# Patient Record
Sex: Female | Born: 1980 | Race: Black or African American | Hispanic: No | Marital: Single | Smoking: Never smoker
Health system: Southern US, Community
[De-identification: ages and names within clinical notes are randomized; demographics above are authoritative.]

## PROBLEM LIST (undated history)

## (undated) DIAGNOSIS — F988 Other specified behavioral and emotional disorders with onset usually occurring in childhood and adolescence: Secondary | ICD-10-CM

## (undated) DIAGNOSIS — F32A Depression, unspecified: Secondary | ICD-10-CM

## (undated) DIAGNOSIS — F411 Generalized anxiety disorder: Secondary | ICD-10-CM

## (undated) DIAGNOSIS — E559 Vitamin D deficiency, unspecified: Secondary | ICD-10-CM

## (undated) DIAGNOSIS — G43909 Migraine, unspecified, not intractable, without status migrainosus: Secondary | ICD-10-CM

## (undated) DIAGNOSIS — M4800 Spinal stenosis, site unspecified: Secondary | ICD-10-CM

## (undated) DIAGNOSIS — M1990 Unspecified osteoarthritis, unspecified site: Secondary | ICD-10-CM

## (undated) DIAGNOSIS — G4730 Sleep apnea, unspecified: Secondary | ICD-10-CM

## (undated) DIAGNOSIS — F4323 Adjustment disorder with mixed anxiety and depressed mood: Secondary | ICD-10-CM

## (undated) DIAGNOSIS — F329 Major depressive disorder, single episode, unspecified: Secondary | ICD-10-CM

## (undated) DIAGNOSIS — R87619 Unspecified abnormal cytological findings in specimens from cervix uteri: Secondary | ICD-10-CM

## (undated) DIAGNOSIS — E669 Obesity, unspecified: Secondary | ICD-10-CM

## (undated) HISTORY — DX: Obesity, unspecified: E66.9

## (undated) HISTORY — DX: Unspecified osteoarthritis, unspecified site: M19.90

## (undated) HISTORY — DX: Sleep apnea, unspecified: G47.30

## (undated) HISTORY — DX: Spinal stenosis, site unspecified: M48.00

## (undated) HISTORY — DX: Generalized anxiety disorder: F41.1

## (undated) HISTORY — DX: Unspecified abnormal cytological findings in specimens from cervix uteri: R87.619

## (undated) HISTORY — DX: Major depressive disorder, single episode, unspecified: F32.9

## (undated) HISTORY — DX: Vitamin D deficiency, unspecified: E55.9

## (undated) HISTORY — DX: Other specified behavioral and emotional disorders with onset usually occurring in childhood and adolescence: F98.8

## (undated) HISTORY — PX: COLPOSCOPY: SHX161

## (undated) HISTORY — DX: Migraine, unspecified, not intractable, without status migrainosus: G43.909

## (undated) HISTORY — DX: Depression, unspecified: F32.A

## (undated) HISTORY — DX: Adjustment disorder with mixed anxiety and depressed mood: F43.23

---

## 2003-04-11 HISTORY — PX: OTHER SURGICAL HISTORY: SHX169

## 2009-04-10 HISTORY — PX: BREAST SURGERY: SHX581

## 2009-04-27 ENCOUNTER — Encounter: Payer: Self-pay | Admitting: Family Medicine

## 2009-04-27 LAB — CONVERTED CEMR LAB
ALT: 12 units/L
AST: 13 units/L
Creatinine, Ser: 0.69 mg/dL
Glucose: 77 mg/dL
Platelets: 398 10*3/uL

## 2010-05-10 ENCOUNTER — Encounter: Payer: Self-pay | Admitting: Family Medicine

## 2010-05-10 DIAGNOSIS — E559 Vitamin D deficiency, unspecified: Secondary | ICD-10-CM | POA: Insufficient documentation

## 2010-05-18 NOTE — Miscellaneous (Signed)
Summary: old records  Clinical Lists Changes  Problems: Added new problem of UNSPECIFIED VITAMIN D DEFICIENCY (ICD-268.9) Allergies: Added new allergy or adverse reaction of * STRAWBERRIES Added new allergy or adverse reaction of PCN Observations: Added new observation of NKA: F (05/10/2010 13:09) Added new observation of PAST SURG HX: LTCS (05/10/2010 13:09) Added new observation of PAST MED HX: migraines obesity GAD depression-- effexor (05/10/2010 13:09) Added new observation of SGPT (ALT): 12 units/L (04/27/2009 13:09) Added new observation of SGOT (AST): 13 units/L (04/27/2009 13:09) Added new observation of CREATININE: 0.69 mg/dL (16/01/9603 54:09) Added new observation of GLU MON POC: 77 mg/dL (81/19/1478 29:56) Added new observation of PLATELETK/UL: 398 K/uL (04/27/2009 13:09) Added new observation of HGB: 11.5 g/dL (21/30/8657 84:69) Added new observation of WBC COUNT: 6.0 10*3/microliter (04/27/2009 13:09)       Past History:  Past Medical History: migraines obesity GAD depression-- effexor  Past Surgical History: LTCS

## 2010-05-23 ENCOUNTER — Ambulatory Visit: Payer: Self-pay | Admitting: Family Medicine

## 2010-06-03 ENCOUNTER — Encounter: Payer: Self-pay | Admitting: Family Medicine

## 2010-06-03 ENCOUNTER — Ambulatory Visit (INDEPENDENT_AMBULATORY_CARE_PROVIDER_SITE_OTHER): Payer: BC Managed Care – PPO | Admitting: Family Medicine

## 2010-06-03 DIAGNOSIS — G4730 Sleep apnea, unspecified: Secondary | ICD-10-CM | POA: Insufficient documentation

## 2010-06-03 DIAGNOSIS — E669 Obesity, unspecified: Secondary | ICD-10-CM

## 2010-06-03 DIAGNOSIS — F411 Generalized anxiety disorder: Secondary | ICD-10-CM

## 2010-06-07 NOTE — Assessment & Plan Note (Signed)
Summary: NOV: Anxiety, obesity   Vital Signs:  Patient profile:   30 year old female Height:      64 inches Weight:      260.25 pounds BMI:     44.83 Temp:     98.5 degrees F oral Pulse rate:   87 / minute BP sitting:   141 / 89  (right arm) Cuff size:   large  Vitals Entered By: Glendell Docker CMA (June 03, 2010 1:23 PM)  Serial Vital Signs/Assessments:  Time      Position  BP       Pulse  Resp  Temp     By 2:31 PM             137/89                         Nani Gasser MD  CC: New Patient   CC:  New Patient.  History of Present Illness: No CP or SOB. NO prior hxof HTN. Not lseeping well and recently dx wtih sleep apnea. She is schedule for the CPAP titration in about 2 weeks.  Very wasily stressed. She feels she worrier. In general she feels it does interfere with her life. Has been on Effexor for over a year.  Mother is here with her and definitely feels she stresses too much.   Habits & Providers  Alcohol-Tobacco-Diet     Alcohol drinks/day: <1     Tobacco Status: never  Exercise-Depression-Behavior     Does Patient Exercise: yes     Times/week: 3     Drug Use: no  Allergies: 1)  ! Pcn 2)  ! 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  Has one son [name].  Never Smoked Alcohol use-yes Drug use-no Regular exercise-yes No caffine intake. Does Patient Exercise:  yes Smoking Status:  never Drug Use:  no  Review of Systems       No fever/sweats/weakness, unexplained weight loss/gain.  No vison changes.  No difficulty hearing/ringing in ears, hay fever/allergies.  No chest pain/discomfort, palpitations.  No Br lump/nipple discharge.  No cough/wheeze.  No blood in BM, nausea/vomiting/diarrhea.  No nighttime urination, leaking urine, unusual vaginal bleeding, discharge (penis or vagina).  No muscle/joint pain. No rash, change  in mole.  No HA, memory loss.  + anxiety, no sleep d/o, + depression.  No easy bruising/bleeding, unexplained lump   Physical Exam  General:  Well-developed,well-nourished,in no acute distress; alert,appropriate and cooperative throughout examination Head:  Normocephalic and atraumatic without obvious abnormalities. No apparent alopecia or balding. Eyes:  No corneal or conjunctival inflammation noted. EOMI. Perrla. Lungs:  Normal respiratory effort, chest expands symmetrically. Lungs are clear to auscultation, no crackles or wheezes. Heart:  Normal rate and regular rhythm. S1 and S2 normal without gallop, murmur, click, rub or other extra sounds. Skin:  no rashes.   Cervical Nodes:  No lymphadenopathy noted Psych:  Cognition and judgment appear intact. Alert and cooperative with normal attention span and concentration. No apparent delusions, illusions, hallucinations   Impression & Recommendations:  Problem # 1:  ANXIETY STATE, UNSPECIFIED (ICD-300.00) Discussed options. at this point in time wiht her recent dx of OSA will get this treated and see how much of her mood improves and then can adjust meds as needed. Also recommended she consider behavioral therapy long term. She will think  about this .  Her updated medication list for this problem includes:    Effexor Xr 150 Mg Xr24h-cap (Venlafaxine hcl) .Marland Kitchen... Take 1 tablet by mouth once a day  Problem # 2:  OBESITY (ICD-278.00) She is currently on phentermine for [employer] at work.   Problem # 3:  ELEVATED BLOOD PRESSURE WITHOUT DIAGNOSIS OF HYPERTENSION (ICD-796.2) Discssed dx. Reduce salt in the diet. WEight loss will help. Have her nurse at work recheck it as well to make suer not from the phentermine.  Recheck at f/u as well.    Complete Medication List: 1)  Ovcon-35 (28) 0.4-35 Mg-mcg Tabs (Norethindrone-eth estradiol) .... Take 1 tablet by mouth once a day 2)  Effexor Xr 150 Mg Xr24h-cap (Venlafaxine hcl) .... Take 1 tablet by mouth once  a day 3)  Phentermine Hcl 37.5 Mg Tabs (Phentermine hcl) .... Take 1 tablet by mouth once a day  Patient Instructions: 1)  Please schedule a follow-up appointment in 2 months for mood. .    Orders Added: 1)  New Patient Level III [99203]   Immunization History:  Tetanus/Td Immunization History:    Tetanus/Td:  historical (07/23/2001)  Influenza Immunization History:    Influenza:  historical (04/26/2010)   Immunization History:  Tetanus/Td Immunization History:    Tetanus/Td:  Historical (07/23/2001)  Influenza Immunization History:    Influenza:  Historical (04/26/2010)  Current Allergies (reviewed today): ! PCN ! * STRAWBERRIES     Immunization History:  Tetanus/Td Immunization History:    Tetanus/Td:  historical (07/23/2001)  Influenza Immunization History:    Influenza:  historical (04/26/2010)

## 2010-06-15 ENCOUNTER — Encounter: Payer: Self-pay | Admitting: Family Medicine

## 2010-06-21 NOTE — Letter (Signed)
Summary: Intake Forms  Intake Forms   Imported By: Lanelle Bal 06/17/2010 11:10:08  _____________________________________________________________________  External Attachment:    Type:   Image     Comment:   External Document

## 2010-09-16 ENCOUNTER — Ambulatory Visit (INDEPENDENT_AMBULATORY_CARE_PROVIDER_SITE_OTHER): Payer: Self-pay | Admitting: Family Medicine

## 2010-09-16 ENCOUNTER — Encounter: Payer: Self-pay | Admitting: Family Medicine

## 2010-09-16 DIAGNOSIS — F411 Generalized anxiety disorder: Secondary | ICD-10-CM

## 2010-09-16 DIAGNOSIS — G4730 Sleep apnea, unspecified: Secondary | ICD-10-CM

## 2010-09-16 DIAGNOSIS — S0093XA Contusion of unspecified part of head, initial encounter: Secondary | ICD-10-CM

## 2010-09-16 DIAGNOSIS — S0003XA Contusion of scalp, initial encounter: Secondary | ICD-10-CM

## 2010-09-16 DIAGNOSIS — S1093XA Contusion of unspecified part of neck, initial encounter: Secondary | ICD-10-CM

## 2010-09-16 MED ORDER — CITALOPRAM HYDROBROMIDE 20 MG PO TABS: ORAL_TABLET | ORAL | Status: DC

## 2010-09-16 MED ORDER — VENLAFAXINE HCL ER 37.5 MG PO CP24: 37.5000 mg | ORAL_CAPSULE | Freq: Every day | ORAL | Status: DC
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
## 2010-09-16 NOTE — Assessment & Plan Note (Signed)
Discussed GAD-7 score of 13, PHQ-9 score of 19. Not well controlled. Discussed she is really on a high dose of Effexor to not have better results so recommend  Changing medication.  Will wean down and change to citalopram.  Given taper regimen. F/U in 5 weeks.  Call if anyconcerns or if feels too emotional on the wean.  FOrtunately no large stressors in her life right now.

## 2010-09-16 NOTE — Patient Instructions (Signed)
Effexor 37.5mg  take 2 a day for one week then drop to one a day for one week.  Then start the new medicine the next day.

## 2010-09-16 NOTE — Assessment & Plan Note (Signed)
9 cm water of pressure

## 2010-10-20 ENCOUNTER — Encounter: Payer: Self-pay | Admitting: Family Medicine

## 2010-10-20 ENCOUNTER — Ambulatory Visit (INDEPENDENT_AMBULATORY_CARE_PROVIDER_SITE_OTHER): Payer: BC Managed Care – PPO | Admitting: Family Medicine

## 2010-10-20 VITALS — BP 129/83 | HR 94 | Ht 64.0 in | Wt 259.0 lb

## 2010-10-20 DIAGNOSIS — R51 Headache: Secondary | ICD-10-CM

## 2010-10-20 DIAGNOSIS — G43909 Migraine, unspecified, not intractable, without status migrainosus: Secondary | ICD-10-CM | POA: Insufficient documentation

## 2010-10-20 DIAGNOSIS — F419 Anxiety disorder, unspecified: Secondary | ICD-10-CM

## 2010-10-20 DIAGNOSIS — F411 Generalized anxiety disorder: Secondary | ICD-10-CM

## 2010-10-20 MED ORDER — CITALOPRAM HYDROBROMIDE 40 MG PO TABS: 40.0000 mg | ORAL_TABLET | ORAL | Status: DC

## 2010-10-20 MED ORDER — PROPRANOLOL HCL ER 80 MG PO CP24: 80.0000 mg | ORAL_CAPSULE | Freq: Every day | ORAL | Status: DC

## 2010-10-20 NOTE — Assessment & Plan Note (Addendum)
PHQ-9 score of 9, GAD-7 of 18.  Overall she has noticed improvement, and feels it is working better for her than the Effexor. Will inc citalopram to 40mg  and f/u in 6 weeks. Call if any concerns with side effects.

## 2010-10-20 NOTE — Progress Notes (Signed)
  Subjective:    Patient ID: Gabrielle Goodman, female    DOB: [birth year], 30 y.o.   MRN: [id number]  HPI Seh did well wth the change on her medications. She did feel dizziness lasted about 1.5 week as she transitioned. . Mind is racing at nigh,t to difficutl to stay asleep. She has no difficulty initiating sleep. Taking her med in the AM.  Has been on 20mg  citalopram for 3 weeks. She denies any other side effects.   Still having frequent HA. Says they are overall better but still having 3 per week. Will wake u pwith them and they last all day. She says she has been wearing her CPAP regularly.  Review of Systems     Objective:   Physical Exam  Constitutional: She is oriented to person, place, and time. She appears well-developed and well-nourished.  HENT:  Head: Normocephalic and atraumatic.  Cardiovascular: Normal rate, regular rhythm and normal heart sounds.   Pulmonary/Chest: Effort normal and breath sounds normal.  Neurological: She is alert and oriented to person, place, and time.  Skin: Skin is warm and dry.  Psychiatric: She has a normal mood and affect. Her behavior is normal.          Assessment & Plan:  Headache - Discussed starting something for prophylaxis. We discussed starting propranolol. I explained to her it is a blood pressure medication and can cause lightheaded with dizziness initially. If this is not tolerable and she is to call the office. Otherwise I'll see her back in 6-8 weeks. I'm hoping that getting her mood under control also help her headaches as well as her sleep quality which is still poor.

## 2010-10-26 ENCOUNTER — Telehealth: Payer: Self-pay | Admitting: Family Medicine

## 2010-10-26 NOTE — Telephone Encounter (Signed)
Pt called and said she had been experiencing CP with accompanied radiation to upper back pain #7/10.  + indigestion, although pt said she had never had a problem with reflux in the past.  Pain hurts worst with activities.  No family history or patient hx of cardiac problems.  No jaw pain or SOB, No nausea, dizziness.  Pain is sharp and intermittent.  No history of GERD. Plan:  Pt sent to the ED for cardiac workup in spite of the fact that she is only 30 yo.  Less likely, but for safety sake pt was sent since TUMS or GI cocktail had no relieved her pain. Routed to Dr. Linford Arnold for review. Jarvis Newcomer, LPN [facility]

## 2010-11-02 ENCOUNTER — Ambulatory Visit (HOSPITAL_COMMUNITY): Payer: BC Managed Care – PPO | Admitting: Licensed Clinical Social Worker

## 2010-11-23 ENCOUNTER — Encounter: Payer: Self-pay | Admitting: Family Medicine

## 2010-11-24 ENCOUNTER — Ambulatory Visit: Payer: BC Managed Care – PPO | Admitting: Family Medicine

## 2010-12-01 ENCOUNTER — Encounter: Payer: Self-pay | Admitting: Family Medicine

## 2010-12-01 ENCOUNTER — Ambulatory Visit (INDEPENDENT_AMBULATORY_CARE_PROVIDER_SITE_OTHER): Payer: BC Managed Care – PPO | Admitting: Family Medicine

## 2010-12-01 DIAGNOSIS — G43909 Migraine, unspecified, not intractable, without status migrainosus: Secondary | ICD-10-CM

## 2010-12-01 DIAGNOSIS — E669 Obesity, unspecified: Secondary | ICD-10-CM

## 2010-12-01 DIAGNOSIS — F411 Generalized anxiety disorder: Secondary | ICD-10-CM

## 2010-12-01 MED ORDER — PROPRANOLOL HCL 60 MG PO CP24: 60.0000 mg | ORAL_CAPSULE | Freq: Every day | ORAL | Status: DC

## 2010-12-01 MED ORDER — PHENTERMINE HCL 37.5 MG PO CAPS: 37.5000 mg | ORAL_CAPSULE | ORAL | Status: AC

## 2010-12-01 NOTE — Progress Notes (Signed)
  Subjective:    Patient ID: Gabrielle Goodman, female    DOB: [birth year], 30 y.o.   MRN: [id number]  HPI Migraines - Feels like the propranolol is too sedating in the morning but has been fantastic for  Her headaches.Only hd one HA since last vist. Only lasted a couple of hours. Took OTC med and went away.   Anxiety - she feels overall the citalopram overall is much better. She likes the inc dose and has noticed real improvement in her depression and anxiety.   Weight Loss - Exercises 3 day per week. Would like to be 150-180 lbs.  Weighed that before had her son. She is interested in weight loss drug. Has taken phentermine before and did well with it. Has been trying to lose weight for 3 months.  Has been trying to eat more whole grains and veggies. Says will lose a couple of lbs but then gaines in back in a couple of days. Really struggling with inc appetite. Says she constantly feels hungry. No CP or SOB.  No known cardiac dz.    Review of Systems     Objective:   Physical Exam  Constitutional: She is oriented to person, place, and time. She appears well-developed and well-nourished.  HENT:  Head: Normocephalic and atraumatic.  Cardiovascular: Normal rate, regular rhythm and normal heart sounds.   Pulmonary/Chest: Effort normal and breath sounds normal.  Neurological: She is alert and oriented to person, place, and time.  Skin: Skin is warm and dry.  Psychiatric: She has a normal mood and affect. Her behavior is normal.          Assessment & Plan:

## 2010-12-01 NOTE — Assessment & Plan Note (Signed)
Well controlled in the propranolol but too sedating. Will dec dose to 60mg  and f/u in 2 months. Can also move med to after dinner instead of bedtime and see if helps.

## 2010-12-01 NOTE — Assessment & Plan Note (Signed)
IMproved. PHq-9 still 8.  GAD-7 not admin today. F/U in 2 mo. She is happy with the improvement.

## 2010-12-01 NOTE — Assessment & Plan Note (Signed)
WE discussed the importance of diet and exercise program in conjunction with a weight loss med. Discussed risk and need to monitor BP monthly. Also has to lose weight or will not be refilled. Pt commitst o diet and exercise program with the med.

## 2010-12-22 ENCOUNTER — Other Ambulatory Visit: Payer: Self-pay | Admitting: Family Medicine

## 2011-01-26 ENCOUNTER — Ambulatory Visit (INDEPENDENT_AMBULATORY_CARE_PROVIDER_SITE_OTHER): Payer: BC Managed Care – PPO | Admitting: Family Medicine

## 2011-01-26 VITALS — BP 107/67 | HR 62 | Ht 64.0 in | Wt 256.0 lb

## 2011-01-26 DIAGNOSIS — E669 Obesity, unspecified: Secondary | ICD-10-CM

## 2011-01-26 NOTE — Progress Notes (Signed)
  Subjective:    Patient ID: Gabrielle Goodman, female    DOB: [birth year], 30 y.o.   MRN: [id number]  HPI    Review of Systems     Objective:   Physical Exam        Assessment & Plan:  Patient doing well on appetite suppressant.  Here for nurse visit, weight, BP, HR check.  Denies problems with insomnia, heart palpitations or tremors.  Satisfied with weight loss thus far and is working on [employer] and regular exercise.

## 2011-01-29 ENCOUNTER — Encounter: Payer: Self-pay | Admitting: Family Medicine

## 2011-01-29 NOTE — Patient Instructions (Signed)
Return PRN

## 2011-02-02 ENCOUNTER — Other Ambulatory Visit: Payer: Self-pay | Admitting: *Deleted

## 2011-02-02 MED ORDER — PHENTERMINE HCL 37.5 MG PO CAPS: 37.5000 mg | ORAL_CAPSULE | ORAL | Status: AC

## 2011-04-18 ENCOUNTER — Other Ambulatory Visit: Payer: Self-pay | Admitting: Family Medicine

## 2011-04-19 ENCOUNTER — Encounter: Payer: BC Managed Care – PPO | Admitting: Family Medicine

## 2011-04-20 ENCOUNTER — Other Ambulatory Visit: Payer: Self-pay | Admitting: *Deleted

## 2011-04-20 ENCOUNTER — Encounter: Payer: Self-pay | Admitting: Family Medicine

## 2011-04-20 ENCOUNTER — Ambulatory Visit (INDEPENDENT_AMBULATORY_CARE_PROVIDER_SITE_OTHER): Payer: BC Managed Care – PPO | Admitting: Family Medicine

## 2011-04-20 DIAGNOSIS — Z0000 Encounter for general adult medical examination without abnormal findings: Secondary | ICD-10-CM

## 2011-04-20 DIAGNOSIS — E559 Vitamin D deficiency, unspecified: Secondary | ICD-10-CM

## 2011-04-20 DIAGNOSIS — G4730 Sleep apnea, unspecified: Secondary | ICD-10-CM

## 2011-04-20 MED ORDER — CLINDAMYCIN PHOSPHATE 1 % EX GEL: CUTANEOUS | Status: DC

## 2011-04-20 MED ORDER — ESCITALOPRAM OXALATE 20 MG PO TABS: 20.0000 mg | ORAL_TABLET | Freq: Every day | ORAL | Status: DC

## 2011-04-20 MED ORDER — CLINDAMYCIN PHOSPHATE 1 % EX GEL: CUTANEOUS | Status: AC

## 2011-04-20 NOTE — Progress Notes (Signed)
  Subjective:     Gabrielle Goodman is a 31 y.o. female and is here for a comprehensive physical exam. The patient reports no problems.  History   Social History  . Marital Status: Single    Spouse Name: N/A    Number of Children: 1  . Years of Education: N/A   Occupational History  . medical asst    Social History Main Topics  . Smoking status: Never Smoker   . Smokeless tobacco: Not on file  . Alcohol Use: No  . Drug Use: No  . Sexually Active: Not on file   Other Topics Concern  . Not on file   Social History Narrative   No regular exercise.  No caffeine.    Health Maintenance  Topic Date Due  . Influenza Vaccine  01/09/2012  . Pap Smear  05/11/2013  . Tetanus/tdap  03/05/2021    The following portions of the patient's history were reviewed and updated as appropriate: allergies, current medications, past family history, past medical history, past social history, past surgical history and problem list.  Review of Systems A comprehensive review of systems was negative.   Objective:    BP 117/75  Pulse 95  Ht 5\' 4"  (1.626 m)  Wt 259 lb (117.482 kg)  BMI 44.46 kg/m2 General appearance: alert, cooperative, appears stated age and moderately obese Head: atraumatic Eyes: conj clear, EOMi, PEERLA Ears: normal TM's and external ear canals both ears Nose: Nares normal. Septum midline. Mucosa normal. No drainage or sinus tenderness. Throat: lips, mucosa, and tongue normal; teeth and gums normal Neck: no adenopathy, no carotid bruit, no JVD, supple, symmetrical, trachea midline and thyroid not enlarged, symmetric, no tenderness/mass/nodules Back: symmetric, no curvature. ROM normal. No CVA tenderness. Lungs: clear to auscultation bilaterally Heart: regular rate and rhythm, S1, S2 normal, no murmur, click, rub or gallop Abdomen: soft, non-tender; bowel sounds normal; no masses,  no organomegaly Extremities: extremities normal, atraumatic, no cyanosis or edema Pulses: 2+ and  symmetric Skin: Skin color, texture, turgor normal. No rashes or lesions. She does have some fine pustular acne on her upper chest. None on her face.  Lymph nodes: Cervical, supraclavicular, and axillary nodes normal. Neurologic: Grossly normal    Assessment:    Healthy female exam.     Plan:     See After Visit Summary for Counseling Recommendations  Start a regular exercise program and make sure you are eating a healthy diet Try to eat 4 servings of dairy a day or take a calcium supplement (500mg  twice a day). Your vaccines are up to date.  Given lab slip for screening labs.   Pustular acne on her upper chest. Will try clindagel and see if clears her acne  Anxiety - Seh has noticed sig improvement in her mood but still feeling anxious at night.  Will change to lexapor 10mg  dose.  F/U in 1 mo.

## 2011-04-20 NOTE — Patient Instructions (Signed)
Start a regular exercise program and make sure you are eating a healthy diet Try to eat 4 servings of dairy a day or take a calcium supplement (500mg twice a day). Your vaccines are up to date.   

## 2011-04-21 LAB — COMPLETE METABOLIC PANEL WITH GFR
AST: 13 U/L (ref 0–37)
Albumin: 4 g/dL (ref 3.5–5.2)
Alkaline Phosphatase: 69 U/L (ref 39–117)
BUN: 8 mg/dL (ref 6–23)
Calcium: 9.3 mg/dL (ref 8.4–10.5)
Chloride: 103 mEq/L (ref 96–112)
Glucose, Bld: 74 mg/dL (ref 70–99)
Potassium: 4.5 mEq/L (ref 3.5–5.3)
Sodium: 139 mEq/L (ref 135–145)
Total Protein: 6.9 g/dL (ref 6.0–8.3)

## 2011-04-21 LAB — LIPID PANEL
Cholesterol: 196 mg/dL (ref 0–200)
LDL Cholesterol: 107 mg/dL — ABNORMAL HIGH (ref 0–99)
Total CHOL/HDL Ratio: 2.6 Ratio
VLDL: 15 mg/dL (ref 0–40)

## 2011-04-21 LAB — VITAMIN D 25 HYDROXY (VIT D DEFICIENCY, FRACTURES): Vit D, 25-Hydroxy: 25 ng/mL — ABNORMAL LOW (ref 30–89)

## 2011-06-22 ENCOUNTER — Ambulatory Visit: Payer: BC Managed Care – PPO | Admitting: Family Medicine

## 2011-06-22 DIAGNOSIS — Z0289 Encounter for other administrative examinations: Secondary | ICD-10-CM

## 2011-08-16 ENCOUNTER — Other Ambulatory Visit: Payer: Self-pay | Admitting: Family Medicine

## 2012-01-13 ENCOUNTER — Other Ambulatory Visit: Payer: Self-pay | Admitting: Family Medicine

## 2012-02-07 ENCOUNTER — Encounter (HOSPITAL_BASED_OUTPATIENT_CLINIC_OR_DEPARTMENT_OTHER): Payer: Self-pay | Admitting: *Deleted

## 2012-02-07 ENCOUNTER — Emergency Department (HOSPITAL_BASED_OUTPATIENT_CLINIC_OR_DEPARTMENT_OTHER)
Admission: EM | Admit: 2012-02-07 | Discharge: 2012-02-07 | Disposition: A | Discharge status: Home / Self Care | Payer: Medicaid Other | Attending: Emergency Medicine | Admitting: Emergency Medicine

## 2012-02-07 ENCOUNTER — Emergency Department (HOSPITAL_BASED_OUTPATIENT_CLINIC_OR_DEPARTMENT_OTHER): Payer: Medicaid Other

## 2012-02-07 DIAGNOSIS — Z8659 Personal history of other mental and behavioral disorders: Secondary | ICD-10-CM | POA: Insufficient documentation

## 2012-02-07 DIAGNOSIS — Z8669 Personal history of other diseases of the nervous system and sense organs: Secondary | ICD-10-CM | POA: Insufficient documentation

## 2012-02-07 DIAGNOSIS — R109 Unspecified abdominal pain: Secondary | ICD-10-CM | POA: Insufficient documentation

## 2012-02-07 DIAGNOSIS — Z79899 Other long term (current) drug therapy: Secondary | ICD-10-CM | POA: Insufficient documentation

## 2012-02-07 DIAGNOSIS — Z3490 Encounter for supervision of normal pregnancy, unspecified, unspecified trimester: Secondary | ICD-10-CM

## 2012-02-07 DIAGNOSIS — O9989 Other specified diseases and conditions complicating pregnancy, childbirth and the puerperium: Secondary | ICD-10-CM | POA: Insufficient documentation

## 2012-02-07 DIAGNOSIS — E669 Obesity, unspecified: Secondary | ICD-10-CM | POA: Insufficient documentation

## 2012-02-07 LAB — CBC WITH DIFFERENTIAL/PLATELET
Basophils Absolute: 0 10*3/uL (ref 0.0–0.1)
Basophils Relative: 0 % (ref 0–1)
Eosinophils Relative: 1 % (ref 0–5)
HCT: 32.6 % — ABNORMAL LOW (ref 36.0–46.0)
Lymphocytes Relative: 39 % (ref 12–46)
MCHC: 33.4 g/dL (ref 30.0–36.0)
MCV: 89.8 fL (ref 78.0–100.0)
Monocytes Absolute: 0.5 10*3/uL (ref 0.1–1.0)
Platelets: 354 10*3/uL (ref 150–400)
RDW: 14.8 % (ref 11.5–15.5)
WBC: 6.2 10*3/uL (ref 4.0–10.5)

## 2012-02-07 LAB — URINALYSIS, ROUTINE W REFLEX MICROSCOPIC
Bilirubin Urine: NEGATIVE
Hgb urine dipstick: NEGATIVE
Nitrite: NEGATIVE
Protein, ur: NEGATIVE mg/dL
Specific Gravity, Urine: 1.011 (ref 1.005–1.030)
Urobilinogen, UA: 0.2 mg/dL (ref 0.0–1.0)

## 2012-02-07 LAB — PREGNANCY, URINE: Preg Test, Ur: POSITIVE — AB

## 2012-02-07 LAB — WET PREP, GENITAL: Trich, Wet Prep: NONE SEEN

## 2012-02-07 NOTE — ED Provider Notes (Signed)
History     CSN: [id number]  Arrival date & time 02/07/12  4098   First MD Initiated Contact with Patient 02/07/12 [phone]      Chief Complaint  Patient presents with  . pelvic pain, pregnant     (Consider location/radiation/quality/duration/timing/severity/associated sxs/prior treatment) HPI Comments: Patient is a G2P1001 at 5-[redacted] weeks gestation.  She presents today with lower abd pressure that started about a week ago.  She denies any bleeding or spotting.  There is no fever and she denies and bowel or bladder complaints.    Patient is a 31 y.o. female presenting with abdominal pain. The history is provided by the patient.  Abdominal Pain The primary symptoms of the illness include abdominal pain. The primary symptoms of the illness do not include fever, nausea, vomiting, dysuria or vaginal discharge. Episode onset: one week ago. The onset of the illness was gradual. The problem has been gradually worsening.  The patient states that she believes she is currently pregnant. The patient has not had a change in bowel habit. Symptoms associated with the illness do not include chills, constipation, hematuria or frequency.    Past Medical History  Diagnosis Date  . Migraines   . Obesity   . GAD (generalized anxiety disorder)   . Depression     effexor    Past Surgical History  Procedure Date  . Ltcs 2005  . Breast surgery 2011    Breast reduction    Family History  Problem Relation Age of Onset  . Hypertension Mother   . Hypertension Maternal Grandmother   . Diabetes Mother   . Rheum arthritis Paternal Grandmother   . Diabetes Maternal Grandmother   . Hypertension Paternal Grandmother   . Breast cancer Paternal Aunt     History  Substance Use Topics  . Smoking status: Never Smoker   . Smokeless tobacco: Not on file  . Alcohol Use: No    OB History    Grav Para Term Preterm Abortions TAB SAB Ect Mult Living   1               Review of Systems  Constitutional:  Negative for fever and chills.  Gastrointestinal: Positive for abdominal pain. Negative for nausea, vomiting and constipation.  Genitourinary: Negative for dysuria, frequency, hematuria and vaginal discharge.  All other systems reviewed and are negative.    Allergies  Penicillins  Home Medications   Current Outpatient Rx  Name Route Sig Dispense Refill  . PRENATAL MULTIVITAMIN CH Oral Take 1 tablet by mouth daily.    Marland Kitchen CITALOPRAM HYDROBROMIDE 40 MG PO TABS  TAKE 1/2 TABLET BY MOUTH DAILY FOR 1 WEEK, THEN 1 TABLET DAILY 30 tablet 2  . CLINDAMYCIN PHOSPHATE 1 % EX GEL  Apply to affected area 2 times daily 60 g 0  . ESCITALOPRAM OXALATE 20 MG PO TABS Oral Take 1 tablet (20 mg total) by mouth daily. 30 tablet 1  . NORGESTIMATE-ETH ESTRADIOL 0.25-35 MG-MCG PO TABS Oral Take 1 tablet by mouth daily.      Marland Kitchen PROPRANOLOL HCL 60 MG PO CP24 Oral Take 1 capsule (60 mg total) by mouth daily. 30 capsule 1    BP 122/64  Pulse 85  Temp 99.2 F (37.3 C) (Oral)  Resp 18  Ht 5\' 4"  (1.626 m)  Wt 260 lb (117.935 kg)  BMI 44.63 kg/m2  SpO2 100%  LMP 01/02/2012  Physical Exam  Nursing note and vitals reviewed. Constitutional: She is oriented to person, place, and  time. She appears well-developed and well-nourished. No distress.  HENT:  Head: Normocephalic and atraumatic.  Neck: Normal range of motion. Neck supple.  Cardiovascular: Normal rate and regular rhythm.  Exam reveals no gallop and no friction rub.   No murmur heard. Pulmonary/Chest: Effort normal and breath sounds normal. No respiratory distress. She has no wheezes.  Abdominal: Soft. Bowel sounds are normal. She exhibits no distension. There is no tenderness.  Genitourinary: Vagina normal and uterus normal. No vaginal discharge found.       There is no cmt or adnexal masses or tenderness.  Musculoskeletal: Normal range of motion.  Neurological: She is alert and oriented to person, place, and time.  Skin: Skin is warm and dry. She is  not diaphoretic.    ED Course  Procedures (including critical care time)   Labs Reviewed  URINALYSIS, ROUTINE W REFLEX MICROSCOPIC  PREGNANCY, URINE  CBC WITH DIFFERENTIAL  HCG, QUANTITATIVE, PREGNANCY   No results found.   No diagnosis found.    MDM  The patient presents here with lower abd pain and is 5-[redacted] weeks pregnant.  This is confirmed with ultrasound.  There is no evidence of ectopic pregnancy, toa, or ovarian cyst.  No fever or wbc and exam not consistent with appendicitis.  Will discharge to home with prn tylenol.  Follow up with OB.          Geoffery Lyons, MD 02/07/12 1126

## 2012-02-07 NOTE — ED Notes (Signed)
Pt amb to room 10 with quick steady gait in nad. Pt reports lower pelvic pain since finding out she is pregnant by both hpt and blood work at her pcp on 10/23. Denies any bleeding, discharge, fevers, dysuria, back pain or other c/o. Pt describes pain as "pressure".

## 2012-02-07 NOTE — ED Notes (Signed)
Patient transported to Ultrasound 

## 2012-02-08 LAB — GC/CHLAMYDIA PROBE AMP, GENITAL
Chlamydia, DNA Probe: NEGATIVE
GC Probe Amp, Genital: NEGATIVE

## 2012-02-11 ENCOUNTER — Other Ambulatory Visit: Payer: Self-pay | Admitting: Family Medicine

## 2012-02-15 ENCOUNTER — Emergency Department (HOSPITAL_BASED_OUTPATIENT_CLINIC_OR_DEPARTMENT_OTHER): Payer: Medicaid Other

## 2012-02-15 ENCOUNTER — Emergency Department (HOSPITAL_BASED_OUTPATIENT_CLINIC_OR_DEPARTMENT_OTHER)
Admission: EM | Admit: 2012-02-15 | Discharge: 2012-02-15 | Disposition: A | Discharge status: Home / Self Care | Payer: Medicaid Other | Attending: Emergency Medicine | Admitting: Emergency Medicine

## 2012-02-15 ENCOUNTER — Encounter (HOSPITAL_BASED_OUTPATIENT_CLINIC_OR_DEPARTMENT_OTHER): Payer: Self-pay | Admitting: Emergency Medicine

## 2012-02-15 DIAGNOSIS — O9989 Other specified diseases and conditions complicating pregnancy, childbirth and the puerperium: Secondary | ICD-10-CM | POA: Insufficient documentation

## 2012-02-15 DIAGNOSIS — O99340 Other mental disorders complicating pregnancy, unspecified trimester: Secondary | ICD-10-CM | POA: Insufficient documentation

## 2012-02-15 DIAGNOSIS — E669 Obesity, unspecified: Secondary | ICD-10-CM | POA: Insufficient documentation

## 2012-02-15 DIAGNOSIS — R109 Unspecified abdominal pain: Secondary | ICD-10-CM | POA: Insufficient documentation

## 2012-02-15 DIAGNOSIS — R110 Nausea: Secondary | ICD-10-CM | POA: Insufficient documentation

## 2012-02-15 DIAGNOSIS — O209 Hemorrhage in early pregnancy, unspecified: Secondary | ICD-10-CM | POA: Insufficient documentation

## 2012-02-15 DIAGNOSIS — Z3490 Encounter for supervision of normal pregnancy, unspecified, unspecified trimester: Secondary | ICD-10-CM

## 2012-02-15 DIAGNOSIS — Z79899 Other long term (current) drug therapy: Secondary | ICD-10-CM | POA: Insufficient documentation

## 2012-02-15 LAB — ABO/RH: ABO/RH(D): B POS

## 2012-02-15 LAB — URINALYSIS, ROUTINE W REFLEX MICROSCOPIC
Bilirubin Urine: NEGATIVE
Ketones, ur: NEGATIVE mg/dL
Nitrite: NEGATIVE
Urobilinogen, UA: 0.2 mg/dL (ref 0.0–1.0)

## 2012-02-15 LAB — HCG, QUANTITATIVE, PREGNANCY: hCG, Beta Chain, Quant, S: 24608 m[IU]/mL — ABNORMAL HIGH (ref <5)

## 2012-02-15 NOTE — ED Notes (Signed)
[redacted] weeks pregnant and having vaginal spotting x 1 hour. Slight abdominal cramping.

## 2012-02-15 NOTE — ED Notes (Signed)
Pt returned from US at this time.

## 2012-02-15 NOTE — ED Notes (Signed)
Pt given water per request

## 2012-02-15 NOTE — ED Provider Notes (Signed)
History     CSN: [id number]  Arrival date & time 02/15/12  1555   First MD Initiated Contact with Patient 02/15/12 1700      Chief Complaint  Patient presents with  . Vaginal Bleeding    (Consider location/radiation/quality/duration/timing/severity/associated sxs/prior treatment) Patient is a 31 y.o. female presenting with vaginal bleeding. The history is provided by the patient. No language interpreter was used.  Vaginal Bleeding This is a new problem. The current episode started today. The problem occurs constantly. The problem has been unchanged. Associated symptoms include abdominal pain and nausea. Nothing aggravates the symptoms. She has tried nothing for the symptoms. Improvement on treatment: no treatment.  Pt reports she was spotting today.   Pt is early pregnant.    Past Medical History  Diagnosis Date  . Migraines   . Obesity   . GAD (generalized anxiety disorder)   . Depression     effexor    Past Surgical History  Procedure Date  . Ltcs 2005  . Breast surgery 2011    Breast reduction    Family History  Problem Relation Age of Onset  . Hypertension Mother   . Hypertension Maternal Grandmother   . Diabetes Mother   . Rheum arthritis Paternal Grandmother   . Diabetes Maternal Grandmother   . Hypertension Paternal Grandmother   . Breast cancer Paternal Aunt     History  Substance Use Topics  . Smoking status: Never Smoker   . Smokeless tobacco: Not on file  . Alcohol Use: No    OB History    Grav Para Term Preterm Abortions TAB SAB Ect Mult Living   1               Review of Systems  Gastrointestinal: Positive for nausea and abdominal pain.  Genitourinary: Positive for vaginal bleeding.  All other systems reviewed and are negative.    Allergies  Penicillins  Home Medications   Current Outpatient Rx  Name  Route  Sig  Dispense  Refill  . CITALOPRAM HYDROBROMIDE 40 MG PO TABS      TAKE 1/2 TABLET BY MOUTH DAILY FOR 1 WEEK, THEN 1  TABLET DAILY   30 tablet   2   . CITALOPRAM HYDROBROMIDE 40 MG PO TABS      TAKE 1/2 TABLET BY MOUTH DAILY FOR 1 WEEK, THEN 1 TABLET DAILY   30 tablet   2   . CLINDAMYCIN PHOSPHATE 1 % EX GEL      Apply to affected area 2 times daily   60 g   0   . ESCITALOPRAM OXALATE 20 MG PO TABS   Oral   Take 1 tablet (20 mg total) by mouth daily.   30 tablet   1   . NORGESTIMATE-ETH ESTRADIOL 0.25-35 MG-MCG PO TABS   Oral   Take 1 tablet by mouth daily.           Marland Kitchen PRENATAL MULTIVITAMIN CH   Oral   Take 1 tablet by mouth daily.         Marland Kitchen PROPRANOLOL HCL 60 MG PO CP24   Oral   Take 1 capsule (60 mg total) by mouth daily.   30 capsule   1     BP 116/71  Pulse 79  Temp 99 F (37.2 C) (Oral)  Resp 20  SpO2 100%  LMP 01/02/2012  Physical Exam  Nursing note and vitals reviewed. Constitutional: She appears well-developed and well-nourished.  HENT:  Head: Normocephalic and atraumatic.  Eyes: Pupils are equal, round, and reactive to light.  Neck: Normal range of motion. Neck supple.  Cardiovascular: Normal rate and normal heart sounds.   Pulmonary/Chest: Effort normal.  Abdominal: Soft. There is tenderness.  Musculoskeletal: Normal range of motion.  Neurological: She is alert.  Skin: Skin is warm.  Psychiatric: She has a normal mood and affect.    ED Course  Procedures (including critical care time)  Labs Reviewed  PREGNANCY, URINE - Abnormal; Notable for the following:    Preg Test, Ur POSITIVE (*)     All other components within normal limits  URINALYSIS, ROUTINE W REFLEX MICROSCOPIC  HCG, QUANTITATIVE, PREGNANCY  ABO/RH   No results found.   No diagnosis found.    MDM   Results for orders placed during the hospital encounter of 02/15/12  URINALYSIS, ROUTINE W REFLEX MICROSCOPIC      Component Value Range   Color, Urine YELLOW  YELLOW   APPearance CLEAR  CLEAR   Specific Gravity, Urine 1.014  1.005 - 1.030   pH 6.0  5.0 - 8.0   Glucose, UA  NEGATIVE  NEGATIVE mg/dL   Hgb urine dipstick NEGATIVE  NEGATIVE   Bilirubin Urine NEGATIVE  NEGATIVE   Ketones, ur NEGATIVE  NEGATIVE mg/dL   Protein, ur NEGATIVE  NEGATIVE mg/dL   Urobilinogen, UA 0.2  0.0 - 1.0 mg/dL   Nitrite NEGATIVE  NEGATIVE   Leukocytes, UA NEGATIVE  NEGATIVE  PREGNANCY, URINE      Component Value Range   Preg Test, Ur POSITIVE (*) NEGATIVE  HCG, QUANTITATIVE, PREGNANCY      Component Value Range   hCG, Beta Chain, Quant, S 86578 (*) <5 mIU/mL  ABO/RH      Component Value Range   ABO/RH(D) B POS     No rh immune globuloin NOT A RH IMMUNE GLOBULIN CANDIDATE, PT RH POSITIVE     US Ob Comp Less 14 Wks  02/15/2012  *RADIOLOGY REPORT*  Clinical Data: 31 year old G2 P1, LMP 01/02/2012 (6 weeks 2 days), quantitative beta HCG 24,608, presenting with vaginal spotting.  OBSTETRIC <14 WK [place] AND TRANSVAGINAL OB US  Technique:  Both transabdominal and transvaginal ultrasound examinations were performed for complete evaluation of the gestation as well as the maternal uterus, adnexal regions, and pelvic cul-de-sac.  Transvaginal technique was performed to assess early pregnancy.  Comparison:  Early OB ultrasound 02/07/2012.  Intrauterine gestational sac:  Single, normal in shape. Yolk sac: Visualized. Embryo: Visualized. Cardiac Activity: Visualized. Heart Rate: 119 bpm  CRL: 5  mm  6 w  2 d          [place] EDC: 10/08/2012.  Maternal uterus/adnexae: No subchorionic hemorrhage.  Normal decidual reaction.  Ovaries not visualized.  No adnexal masses or free pelvic fluid.  IMPRESSION:  1.  Single live intrauterine embryo with a gestational age [redacted] weeks 2 days by crown-rump length, corresponding exactly with the estimated gestational age by the initial ultrasound and the LMP. [facility] 10/08/2012. 2.  No subchorionic hemorrhage. 3.  Nonvisualization of the ovaries.  No adnexal masses or free pelvic fluid.   Original Report Authenticated By: Hulan Saas, M.D.    US Ob Comp Less 14  Wks  02/07/2012  *RADIOLOGY REPORT*  Clinical Data: 5 weeks 1 day pregnant by last menstrual period. Pelvic pressure.  Morbid obesity.  OBSTETRIC <14 WK [place] AND TRANSVAGINAL OB US  Technique:  Both transabdominal and transvaginal ultrasound examinations were performed for complete evaluation of the gestation as well  as the maternal uterus, adnexal regions, and pelvic cul-de-sac.  Transvaginal technique was performed to assess early pregnancy.  Comparison:  None.  Intrauterine gestational sac:  Visualized/normal in shape. Yolk sac: Present Embryo: Not identified. Cardiac Activity: Not applicable  MSD: 8 mm  5 w 4 d      [place] EDC: 10/05/2012  Maternal uterus/adnexae: No subchorionic hemorrhage.  Normal appearance of the right ovary. A hypoechoic 1.7 cm left ovarian lesion could represent a corpus luteal cyst.  No significant free fluid.  IMPRESSION: Gestational sac and yolk sac identified, most consistent with an intrauterine pregnancy of approximately 5 weeks 4 days.  Probable left ovarian corpus luteal cyst.   Original Report Authenticated By: Consuello Bossier, M.D.    US Ob Transvaginal  02/15/2012  *RADIOLOGY REPORT*  Clinical Data: 31 year old G2 P1, LMP 01/02/2012 (6 weeks 2 days), quantitative beta HCG 24,608, presenting with vaginal spotting.  OBSTETRIC <14 WK [place] AND TRANSVAGINAL OB US  Technique:  Both transabdominal and transvaginal ultrasound examinations were performed for complete evaluation of the gestation as well as the maternal uterus, adnexal regions, and pelvic cul-de-sac.  Transvaginal technique was performed to assess early pregnancy.  Comparison:  Early OB ultrasound 02/07/2012.  Intrauterine gestational sac:  Single, normal in shape. Yolk sac: Visualized. Embryo: Visualized. Cardiac Activity: Visualized. Heart Rate: 119 bpm  CRL: 5  mm  6 w  2 d          [place] EDC: 10/08/2012.  Maternal uterus/adnexae: No subchorionic hemorrhage.  Normal decidual reaction.  Ovaries not visualized.  No adnexal  masses or free pelvic fluid.  IMPRESSION:  1.  Single live intrauterine embryo with a gestational age [redacted] weeks 2 days by crown-rump length, corresponding exactly with the estimated gestational age by the initial ultrasound and the LMP. [facility] 10/08/2012. 2.  No subchorionic hemorrhage. 3.  Nonvisualization of the ovaries.  No adnexal masses or free pelvic fluid.   Original Report Authenticated By: Hulan Saas, M.D.    US Ob Transvaginal  02/07/2012  *RADIOLOGY REPORT*  Clinical Data: 5 weeks 1 day pregnant by last menstrual period. Pelvic pressure.  Morbid obesity.  OBSTETRIC <14 WK [place] AND TRANSVAGINAL OB US  Technique:  Both transabdominal and transvaginal ultrasound examinations were performed for complete evaluation of the gestation as well as the maternal uterus, adnexal regions, and pelvic cul-de-sac.  Transvaginal technique was performed to assess early pregnancy.  Comparison:  None.  Intrauterine gestational sac:  Visualized/normal in shape. Yolk sac: Present Embryo: Not identified. Cardiac Activity: Not applicable  MSD: 8 mm  5 w 4 d      [place] EDC: 10/05/2012  Maternal uterus/adnexae: No subchorionic hemorrhage.  Normal appearance of the right ovary. A hypoechoic 1.7 cm left ovarian lesion could represent a corpus luteal cyst.  No significant free fluid.  IMPRESSION: Gestational sac and yolk sac identified, most consistent with an intrauterine pregnancy of approximately 5 weeks 4 days.  Probable left ovarian corpus luteal cyst.   Original Report Authenticated By: Consuello Bossier, M.D.      Pt's blood type is B positive.   Pt does not need rhogam.  Ultrasound and hcg consistent with growing viable pregnancy      Elson Areas, [place] 02/15/12 2033  Lonia Skinner [place], [place] 02/15/12 2035

## 2012-02-16 NOTE — ED Provider Notes (Signed)
Medical screening examination/treatment/procedure(s) were performed by non-physician practitioner and as supervising physician I was immediately available for consultation/collaboration.   Carleene Cooper III, MD 02/16/12 [phone]

## 2012-03-23 ENCOUNTER — Encounter (HOSPITAL_BASED_OUTPATIENT_CLINIC_OR_DEPARTMENT_OTHER): Payer: Self-pay | Admitting: *Deleted

## 2012-03-23 DIAGNOSIS — Z79899 Other long term (current) drug therapy: Secondary | ICD-10-CM | POA: Insufficient documentation

## 2012-03-23 DIAGNOSIS — E669 Obesity, unspecified: Secondary | ICD-10-CM | POA: Insufficient documentation

## 2012-03-23 DIAGNOSIS — G43909 Migraine, unspecified, not intractable, without status migrainosus: Secondary | ICD-10-CM | POA: Insufficient documentation

## 2012-03-23 DIAGNOSIS — F411 Generalized anxiety disorder: Secondary | ICD-10-CM | POA: Insufficient documentation

## 2012-03-23 DIAGNOSIS — F3289 Other specified depressive episodes: Secondary | ICD-10-CM | POA: Insufficient documentation

## 2012-03-23 DIAGNOSIS — Z331 Pregnant state, incidental: Secondary | ICD-10-CM | POA: Insufficient documentation

## 2012-03-23 DIAGNOSIS — F329 Major depressive disorder, single episode, unspecified: Secondary | ICD-10-CM | POA: Insufficient documentation

## 2012-03-23 NOTE — ED Notes (Signed)
Pt is [redacted] weeks pregnant and has a hx of migraines. Has had this one for 3 days. Called OB, and they told her to take Excedrin Migraine, which she did, but no relief. PERL. Denies other s/s.

## 2012-03-24 ENCOUNTER — Emergency Department (HOSPITAL_BASED_OUTPATIENT_CLINIC_OR_DEPARTMENT_OTHER)
Admission: EM | Admit: 2012-03-24 | Discharge: 2012-03-24 | Disposition: A | Discharge status: Home / Self Care | Payer: Medicaid Other | Attending: Emergency Medicine | Admitting: Emergency Medicine

## 2012-03-24 DIAGNOSIS — G43909 Migraine, unspecified, not intractable, without status migrainosus: Secondary | ICD-10-CM

## 2012-03-24 MED ORDER — SODIUM CHLORIDE 0.9 % IV BOLUS (SEPSIS)
1000.0000 mL | Freq: Once | INTRAVENOUS | Status: AC
  Administered 2012-03-24: 1000 mL via INTRAVENOUS

## 2012-03-24 MED ORDER — DIPHENHYDRAMINE HCL 50 MG/ML IJ SOLN
12.5000 mg | Freq: Once | INTRAMUSCULAR | Status: AC
Start: 2012-03-24 — End: 2012-03-24
  Administered 2012-03-24: 12.5 mg via INTRAVENOUS
  Filled 2012-03-24: qty 1

## 2012-03-24 MED ORDER — METOCLOPRAMIDE HCL 5 MG/ML IJ SOLN
10.0000 mg | Freq: Once | INTRAMUSCULAR | Status: AC
  Administered 2012-03-24: 10 mg via INTRAVENOUS
  Filled 2012-03-24: qty 2

## 2012-03-24 NOTE — ED Provider Notes (Signed)
History    This chart was scribed for Gabrielle Sherk Gabrielle Cords, MD, MD by Gabrielle Goodman, ED Scribe. The patient was seen in room MH01 and the patient's care was started at 12:15PM.   CSN: [id number]  Arrival date & time 03/23/12  2349   None     Chief Complaint  Patient presents with  . Migraine    (Consider location/radiation/quality/duration/timing/severity/associated sxs/prior treatment) Patient is a 31 y.o. female presenting with migraines. The history is provided by the patient. No language interpreter was used.  Migraine This is a recurrent problem. The current episode started more than 2 days ago. The problem occurs constantly. The problem has not changed since onset.Associated symptoms include headaches. Pertinent negatives include no chest pain, no abdominal pain and no shortness of breath. Nothing aggravates the symptoms. Nothing relieves the symptoms. Treatments tried: excedrin migraine as directed by her OB. The treatment provided no relief.   Gabrielle Goodman is a 31 y.o. female with hx of migraines who presents to the Emergency Department complaining of constant, migraine headache onset 3 days ago. This migraine is not as severe as past migraines, this a typical migraine in a typical location for the patient. Pt reports that her OB told her take Excedrin migraine but she did not have any relief. Pt is [redacted] weeks pregnant. The headache is throbbing and  located in temporal area. She has not had migraine in a long time due to normally taking Topamax. She reports mild nasal congestion but reports she had cold last week. Denies changes in speech, vision, weakness, numbness, confusion, fever, nausea, vomiting, visual disturbances and any other pain. There are no focal neurological deficits. No photo nor phonophobia.    OB is [name]   Past Medical History  Diagnosis Date  . Migraines   . Obesity   . GAD (generalized anxiety disorder)   . Depression     effexor    Past Surgical  History  Procedure Date  . Ltcs 2005  . Breast surgery 2011    Breast reduction    Family History  Problem Relation Age of Onset  . Hypertension Mother   . Hypertension Maternal Grandmother   . Diabetes Mother   . Rheum arthritis Paternal Grandmother   . Diabetes Maternal Grandmother   . Hypertension Paternal Grandmother   . Breast cancer Paternal Aunt     History  Substance Use Topics  . Smoking status: Never Smoker   . Smokeless tobacco: Not on file  . Alcohol Use: No    OB History    Grav Para Term Preterm Abortions TAB SAB Ect Mult Living   2 1              Review of Systems  Constitutional: Negative for fever and chills.  HENT: Negative for trouble swallowing and voice change.   Eyes: Negative for photophobia and visual disturbance.  Respiratory: Negative for shortness of breath.   Cardiovascular: Negative for chest pain.  Gastrointestinal: Negative for nausea, vomiting and abdominal pain.  Neurological: Positive for headaches. Negative for dizziness, seizures, facial asymmetry, speech difficulty, weakness, light-headedness and numbness.  Psychiatric/Behavioral: Negative for confusion.  All other systems reviewed and are negative.    Allergies  Penicillins  Home Medications   Current Outpatient Rx  Name  Route  Sig  Dispense  Refill  . CITALOPRAM HYDROBROMIDE 40 MG PO TABS      TAKE 1/2 TABLET BY MOUTH DAILY FOR 1 WEEK, THEN 1 TABLET DAILY   30  tablet   2   . CITALOPRAM HYDROBROMIDE 40 MG PO TABS      TAKE 1/2 TABLET BY MOUTH DAILY FOR 1 WEEK, THEN 1 TABLET DAILY   30 tablet   2   . CLINDAMYCIN PHOSPHATE 1 % EX GEL      Apply to affected area 2 times daily   60 g   0   . ESCITALOPRAM OXALATE 20 MG PO TABS   Oral   Take 1 tablet (20 mg total) by mouth daily.   30 tablet   1   . NORGESTIMATE-ETH ESTRADIOL 0.25-35 MG-MCG PO TABS   Oral   Take 1 tablet by mouth daily.           Marland Kitchen PRENATAL MULTIVITAMIN CH   Oral   Take 1 tablet by  mouth daily.         Marland Kitchen PROPRANOLOL HCL 60 MG PO CP24   Oral   Take 1 capsule (60 mg total) by mouth daily.   30 capsule   1     BP 118/67  Pulse 87  Temp 98.5 F (36.9 C) (Oral)  Resp 20  Ht 5\' 4"  (1.626 m)  Wt 271 lb (122.925 kg)  BMI 46.52 kg/m2  SpO2 100%  LMP 01/02/2012  Physical Exam  Nursing note and vitals reviewed. Constitutional: She is oriented to person, place, and time. She appears well-developed and well-nourished. No distress.       Comfortable in the room with all lights on  HENT:  Head: Normocephalic and atraumatic.  Right Ear: External ear normal.  Left Ear: External ear normal.  Eyes: EOM are normal. Pupils are equal, round, and reactive to light.  Neck: Normal range of motion. Neck supple.  Cardiovascular: Normal rate, regular rhythm, normal heart sounds and intact distal pulses.   Pulmonary/Chest: Effort normal and breath sounds normal. No respiratory distress. She has no wheezes.  Abdominal: Soft. Bowel sounds are normal. There is no tenderness. There is no rebound and no guarding.  Genitourinary:       Uterus is 2 inches outside of pelvis  Musculoskeletal: Normal range of motion. She exhibits no edema and no tenderness.  Lymphadenopathy:    She has no cervical adenopathy.  Neurological: She is alert and oriented to person, place, and time. She has normal reflexes. No cranial nerve deficit.       Cranial nerves 2-12 intact   Skin: Skin is warm and dry.  Psychiatric: She has a normal mood and affect. Her behavior is normal.    ED Course  Procedures (including critical care time) DIAGNOSTIC STUDIES: Oxygen Saturation is 100% on room air, normal by my interpretation.    COORDINATION OF CARE: 12:23 AM Discussed ED treatment with pt  12:23 AM Ordered:    . diphenhydrAMINE  12.5 mg Intravenous Once  . metoCLOPramide (REGLAN) injection  10 mg Intravenous Once       Labs Reviewed - No data to display No results found.   No diagnosis  found.    MDM  Symptoms typical for patient.  No indication for imaging at this time.  Follow up on Monday with your headache specialist.  Return for weakness numbness changes in speech cognition or any concerns   Patient markedly improved following migraine cocktail       I personally performed the services described in this documentation, which was scribed in my presence. The recorded information has been reviewed and is accurate.     Aiyanah Kalama K Elvina Bosch-Rasch,  MD 03/24/12 [id number]

## 2012-03-24 NOTE — ED Notes (Signed)
MD at bedside. 

## 2012-03-24 NOTE — ED Notes (Signed)
FHT 140 suprapubic 

## 2012-03-24 NOTE — ED Notes (Signed)
Per Dr. Nicanor Alcon, pt was advised to speak with her OBGYN again prior to taking any more Excedrin Migraine.  Pt verbalized understanding and agreed.

## 2012-05-12 ENCOUNTER — Other Ambulatory Visit: Payer: Self-pay | Admitting: Family Medicine

## 2012-05-25 ENCOUNTER — Other Ambulatory Visit: Payer: Self-pay

## 2012-07-15 ENCOUNTER — Other Ambulatory Visit: Payer: Self-pay | Admitting: Family Medicine

## 2013-02-13 ENCOUNTER — Other Ambulatory Visit: Payer: Self-pay

## 2013-09-11 IMAGING — US US OB TRANSVAGINAL
1 series · 14 of 28 positions shown · non-contrast
Comparison: Early OB ultrasound 02/07/2012.

quantitative beta HCG [DATE], presenting with vaginal spotting.

OBSTETRIC <14 WK US AND TRANSVAGINAL OB US
TECHNIQUE: Both transabdominal and transvaginal ultrasound
examinations were performed for complete evaluation of the
gestation as well as the maternal uterus, adnexal regions, and
pelvic cul-de-sac.  Transvaginal technique was performed to assess
early pregnancy.

[Series 1: us ob transvaginal · 0.30mm/px · 14 of 52 slices shown]
[im 2/52]
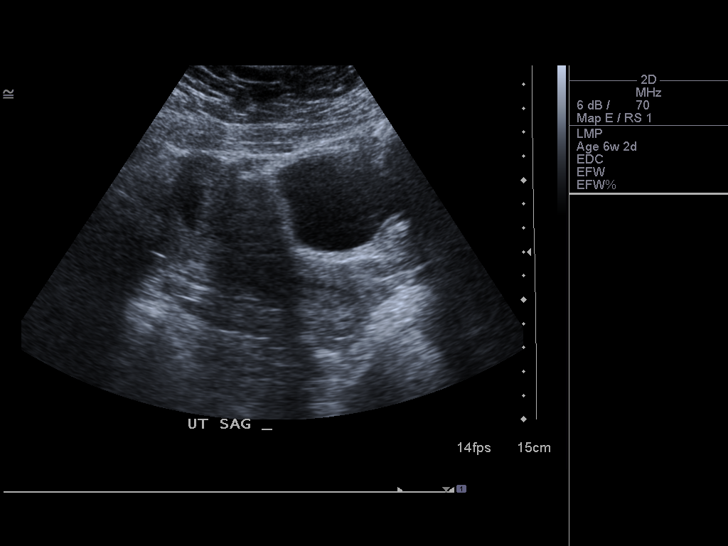
[im 6/52]
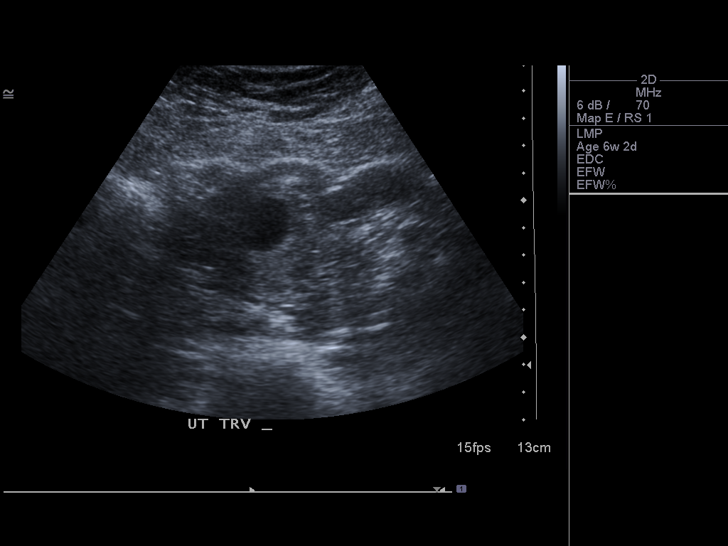
[im 10/52]
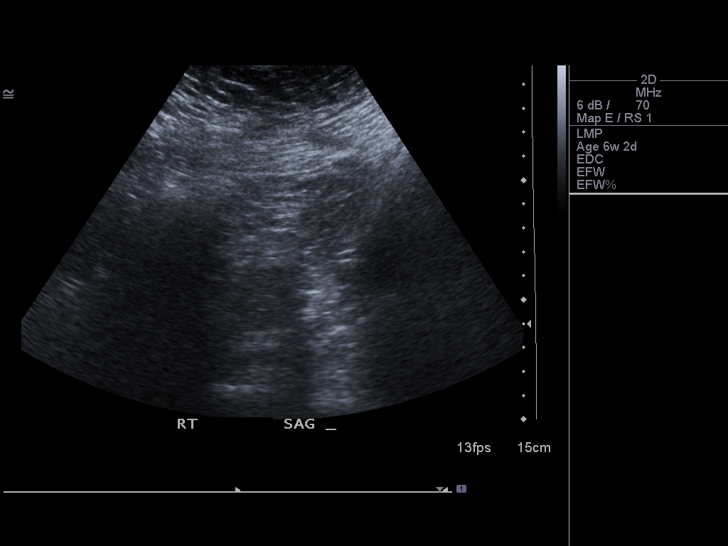
[im 14/52]
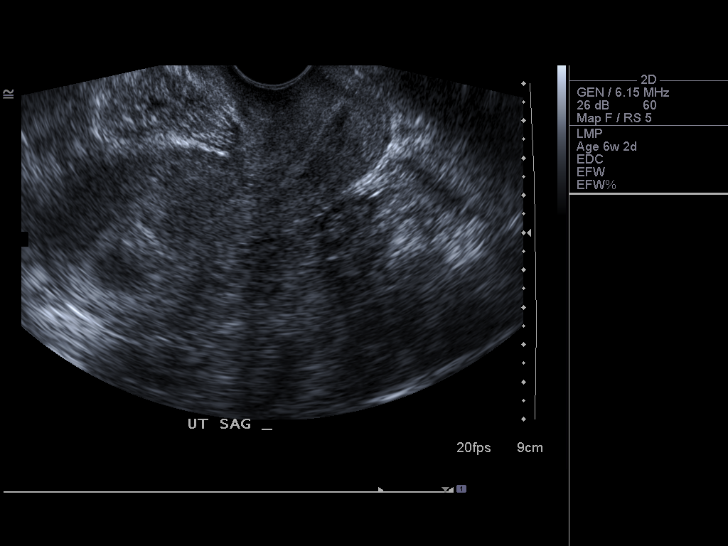
[im 18/52]
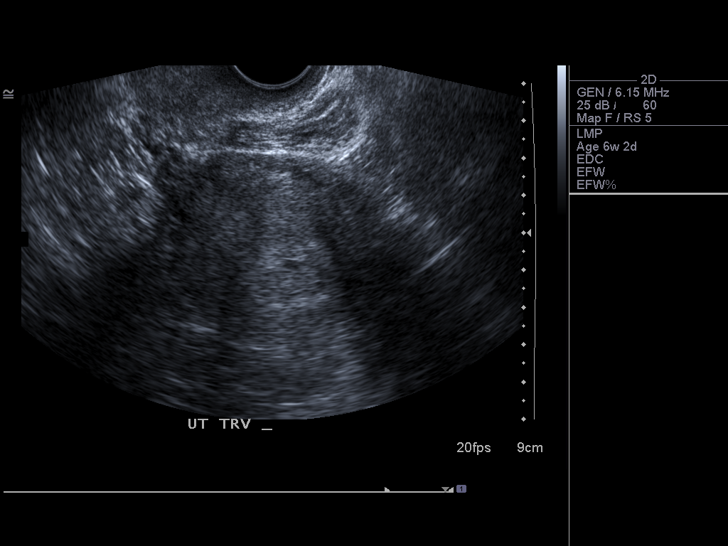
[im 21/52]
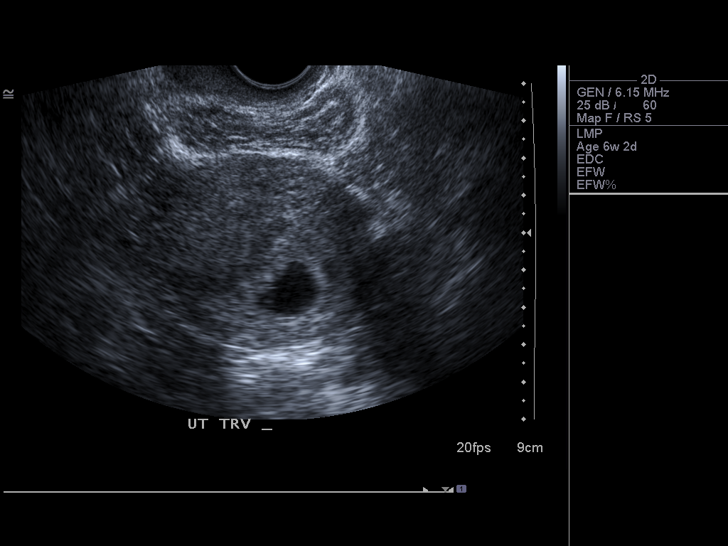
[im 25/52]
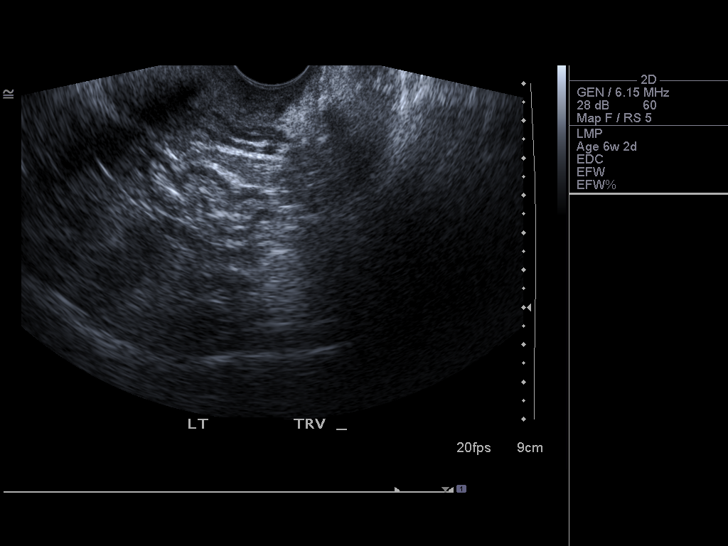
[im 29/52]
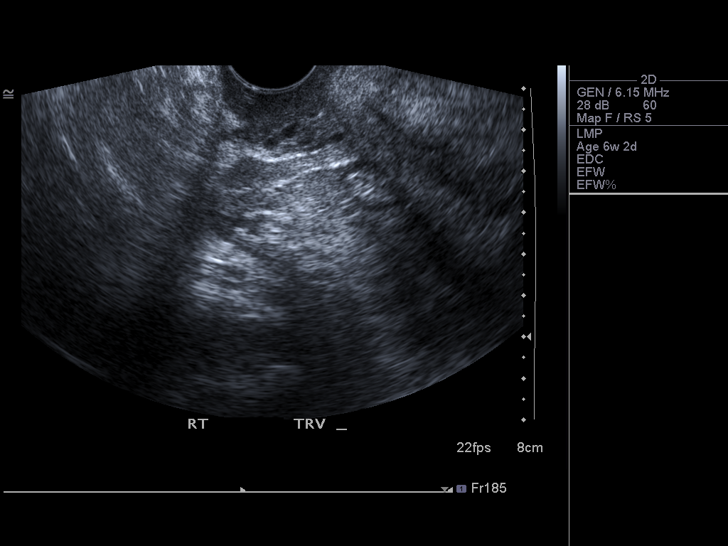
[im 33/52]
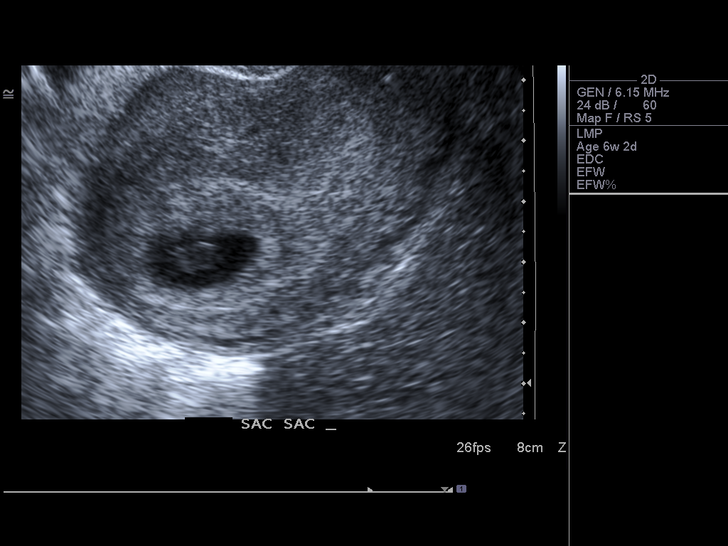
[im 36/52]
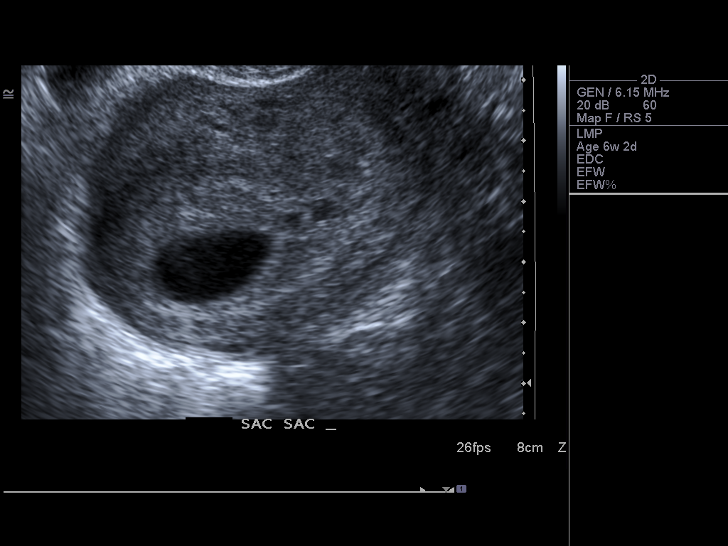
[im 40/52]
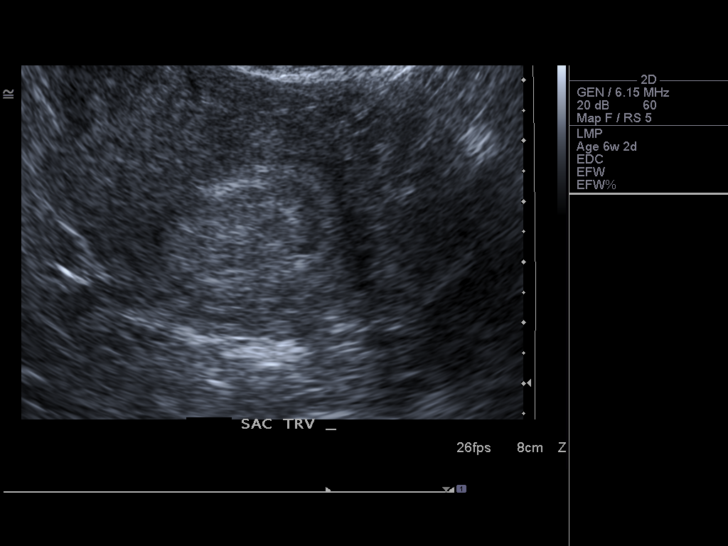
[im 44/52]
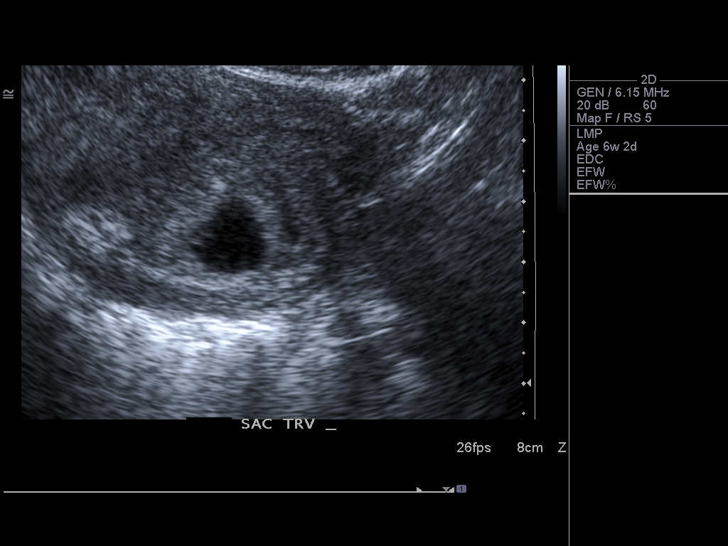
[im 48/52]
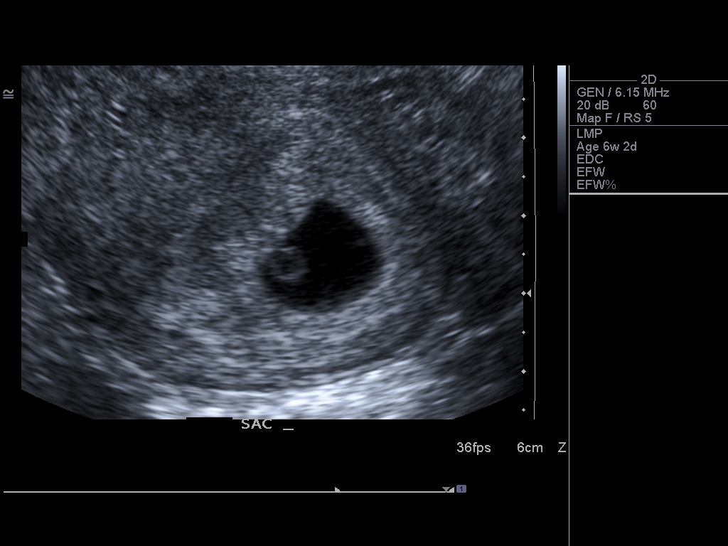
[im 52/52]
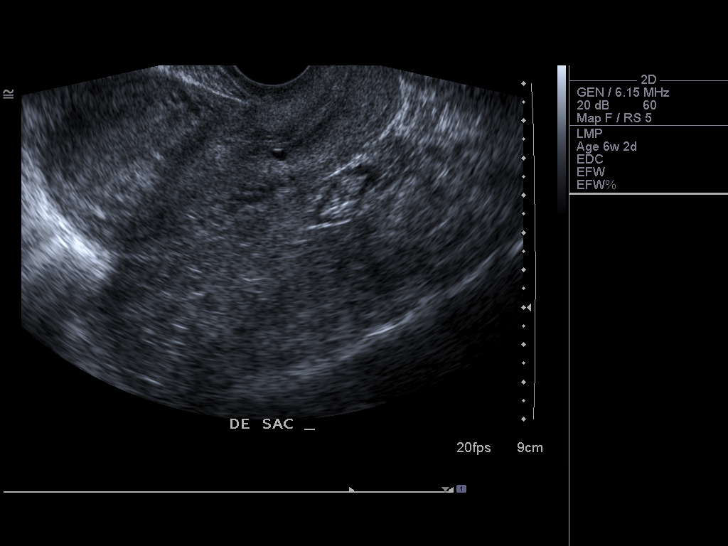

[14 of 28 positions shown; findings below may reference images not displayed]

Intrauterine gestational sac:  Single, normal in shape.
Yolk sac: Visualized.
Embryo: Visualized.
Cardiac Activity: Visualized.
Heart Rate: 119 bpm

CRL: 5  mm  6 w  2 d          US EDC: 10/08/2012.

Maternal uterus/adnexae:
No subchorionic hemorrhage.  Normal decidual reaction.  Ovaries not
visualized.  No adnexal masses or free pelvic fluid.
IMPRESSION: 1.  Single live intrauterine embryo with a gestational age 6 weeks
2 days by crown-rump length, corresponding exactly with the
estimated gestational age by the initial ultrasound and the LMP.
EDC 10/08/2012.
2.  No subchorionic hemorrhage.
3.  Nonvisualization of the ovaries.  No adnexal masses or free
pelvic fluid.

## 2014-02-09 ENCOUNTER — Encounter (HOSPITAL_BASED_OUTPATIENT_CLINIC_OR_DEPARTMENT_OTHER): Payer: Self-pay | Admitting: *Deleted

## 2014-07-20 ENCOUNTER — Emergency Department (HOSPITAL_BASED_OUTPATIENT_CLINIC_OR_DEPARTMENT_OTHER)
Admission: EM | Admit: 2014-07-20 | Discharge: 2014-07-20 | Disposition: A | Discharge status: Home / Self Care | Payer: Self-pay | Attending: Emergency Medicine | Admitting: Emergency Medicine

## 2014-07-20 ENCOUNTER — Emergency Department (HOSPITAL_BASED_OUTPATIENT_CLINIC_OR_DEPARTMENT_OTHER): Payer: Medicaid Other

## 2014-07-20 ENCOUNTER — Encounter (HOSPITAL_BASED_OUTPATIENT_CLINIC_OR_DEPARTMENT_OTHER): Payer: Self-pay | Admitting: *Deleted

## 2014-07-20 DIAGNOSIS — Z793 Long term (current) use of hormonal contraceptives: Secondary | ICD-10-CM | POA: Insufficient documentation

## 2014-07-20 DIAGNOSIS — Z880 Allergy status to penicillin: Secondary | ICD-10-CM | POA: Insufficient documentation

## 2014-07-20 DIAGNOSIS — J40 Bronchitis, not specified as acute or chronic: Secondary | ICD-10-CM | POA: Insufficient documentation

## 2014-07-20 DIAGNOSIS — Z79899 Other long term (current) drug therapy: Secondary | ICD-10-CM | POA: Insufficient documentation

## 2014-07-20 DIAGNOSIS — F411 Generalized anxiety disorder: Secondary | ICD-10-CM | POA: Insufficient documentation

## 2014-07-20 DIAGNOSIS — E669 Obesity, unspecified: Secondary | ICD-10-CM | POA: Insufficient documentation

## 2014-07-20 DIAGNOSIS — F329 Major depressive disorder, single episode, unspecified: Secondary | ICD-10-CM | POA: Insufficient documentation

## 2014-07-20 DIAGNOSIS — G43909 Migraine, unspecified, not intractable, without status migrainosus: Secondary | ICD-10-CM | POA: Insufficient documentation

## 2014-07-20 DIAGNOSIS — R062 Wheezing: Secondary | ICD-10-CM

## 2014-07-20 MED ORDER — IBUPROFEN 800 MG PO TABS
800.0000 mg | ORAL_TABLET | Freq: Once | ORAL | Status: AC
  Administered 2014-07-20: 800 mg via ORAL
  Filled 2014-07-20: qty 1

## 2014-07-20 MED ORDER — PREDNISONE 10 MG PO TABS
60.0000 mg | ORAL_TABLET | Freq: Once | ORAL | Status: AC
  Administered 2014-07-20: 60 mg via ORAL
  Filled 2014-07-20 (×2): qty 1

## 2014-07-20 MED ORDER — PREDNISONE 20 MG PO TABS: 40.0000 mg | ORAL_TABLET | Freq: Every day | ORAL | Status: DC

## 2014-07-20 MED ORDER — ALBUTEROL SULFATE (2.5 MG/3ML) 0.083% IN NEBU
INHALATION_SOLUTION | RESPIRATORY_TRACT | Status: AC
  Administered 2014-07-20: 5 mg via RESPIRATORY_TRACT
  Filled 2014-07-20: qty 6

## 2014-07-20 MED ORDER — ALBUTEROL SULFATE HFA 108 (90 BASE) MCG/ACT IN AERS
2.0000 | INHALATION_SPRAY | RESPIRATORY_TRACT | Status: DC | PRN
  Administered 2014-07-20: 2 via RESPIRATORY_TRACT
  Filled 2014-07-20: qty 6.7

## 2014-07-20 MED ORDER — ALBUTEROL SULFATE (2.5 MG/3ML) 0.083% IN NEBU
5.0000 mg | INHALATION_SOLUTION | Freq: Once | RESPIRATORY_TRACT | Status: AC
  Administered 2014-07-20: 5 mg via RESPIRATORY_TRACT

## 2014-07-20 MED ORDER — ONDANSETRON 4 MG PO TBDP
4.0000 mg | ORAL_TABLET | Freq: Once | ORAL | Status: AC
  Administered 2014-07-20: 4 mg via ORAL
  Filled 2014-07-20: qty 1

## 2014-07-20 NOTE — Discharge Instructions (Signed)

## 2014-07-20 NOTE — ED Provider Notes (Signed)
CSN: [id number]     Arrival date & time 07/20/14  1817 History   First MD Initiated Contact with Patient 07/20/14 1827     Chief Complaint  Patient presents with  . Shortness of Breath     (Consider location/radiation/quality/duration/timing/severity/associated sxs/prior Treatment) HPI Comments: Pt states that she has had sob and wheezing for the last 2 days. She has had a cough as well. No fever. She states that she had not had any inhaler at home. She hasn't tried anything at home. She has not history of asthma.   The history is provided by the patient. No language interpreter was used.    Past Medical History  Diagnosis Date  . Migraines   . Obesity   . GAD (generalized anxiety disorder)   . Depression     effexor   Past Surgical History  Procedure Laterality Date  . Ltcs  2005  . Breast surgery  2011    Breast reduction  . Cesarean section     Family History  Problem Relation Age of Onset  . Hypertension Mother   . Hypertension Maternal Grandmother   . Diabetes Mother   . Rheum arthritis Paternal Grandmother   . Diabetes Maternal Grandmother   . Hypertension Paternal Grandmother   . Breast cancer Paternal Aunt    History  Substance Use Topics  . Smoking status: Never Smoker   . Smokeless tobacco: Not on file  . Alcohol Use: No   OB History    Gravida Para Term Preterm AB TAB SAB Ectopic Multiple Living   2 1             Review of Systems  All other systems reviewed and are negative.     Allergies  Penicillins  Home Medications   Prior to Admission medications   Medication Sig Start Date End Date Taking? Authorizing Provider  citalopram (CELEXA) 40 MG tablet TAKE 1/2 TABLET BY MOUTH DAILY FOR 1 WEEK, THEN 1 TABLET DAILY 01/13/12   Agapito Games, MD  citalopram (CELEXA) 40 MG tablet TAKE 1/2 TABLET BY MOUTH DAILY FOR 1 WEEK, THEN 1 TABLET DAILY 05/12/12   Agapito Games, MD  escitalopram (LEXAPRO) 20 MG tablet Take 1 tablet (20 mg total)  by mouth daily. 04/20/11 07/19/11  Agapito Games, MD  norgestimate-ethinyl estradiol (ORTHO-CYCLEN) 0.25-35 MG-MCG per tablet Take 1 tablet by mouth daily.      Historical Provider, MD  Prenatal Vit-Fe Fumarate-FA (PRENATAL MULTIVITAMIN) TABS Take 1 tablet by mouth daily.    Historical Provider, MD  propranolol (INDERAL LA) 60 MG 24 hr capsule Take 1 capsule (60 mg total) by mouth daily. 12/01/10 11/26/11  Agapito Games, MD   BP 129/91 mmHg  Pulse 91  Temp(Src) 99.7 F (37.6 C) (Oral)  Resp 24  Ht  (1.626 m)  Wt 280 lb (127.007 kg)  BMI 48.04 kg/m2  SpO2 100%  LMP 06/28/2014  Breastfeeding? Unknown Physical Exam  Constitutional: She is oriented to person, place, and time. She appears well-developed and well-nourished.  HENT:  Head: Normocephalic and atraumatic.  Right Ear: External ear normal.  Left Ear: External ear normal.  Mouth/Throat: Oropharynx is clear and moist.  Neck: Normal range of motion. Neck supple.  Cardiovascular: Normal rate and regular rhythm.   Pulmonary/Chest: She has wheezes.  Musculoskeletal: Normal range of motion.  Neurological: She is alert and oriented to person, place, and time.  Skin: Skin is warm and dry.  Psychiatric: She has a normal  mood and affect.  Nursing note and vitals reviewed.   ED Course  Procedures (including critical care time) Labs Review Labs Reviewed - No data to display  Imaging Review Dg Chest 2 View  07/20/2014   CLINICAL DATA:  Cough and shortness of breath for 2 days.  Wheezing.  EXAM: CHEST  2 VIEW  COMPARISON:  None.  FINDINGS: The cardiomediastinal contours are normal. Mild bronchial thickening and interstitial prominence. Pulmonary vasculature is normal. No consolidation, pleural effusion, or pneumothorax. No acute osseous abnormalities are seen.  IMPRESSION: Mild bronchial thickening and interstitial prominence, suggestive of bronchitis or asthma. No consolidation to suggest pneumonia.   Electronically  Signed   By: Rubye OaksMelanie  Ehinger M.D.   On: 07/20/2014 20:21     EKG Interpretation None      MDM   Final diagnoses:  Wheezing  Bronchitis    No pneumonia noted. On xray. Pt is feeling better after treatment. Will send home with prednisone and albuterol. Pt given return precautions.     Teressa LowerVrinda Latria Mccarron, NP 07/20/14 [id number]  Vanetta MuldersScott Zackowski, MD 07/23/14 [phone]

## 2014-07-20 NOTE — ED Notes (Signed)
Sob x 2 days. Cough today. No hx of asthma. Able to speak in complete sentences.

## 2015-02-13 ENCOUNTER — Emergency Department (HOSPITAL_BASED_OUTPATIENT_CLINIC_OR_DEPARTMENT_OTHER)
Admission: EM | Admit: 2015-02-13 | Discharge: 2015-02-13 | Disposition: A | Discharge status: Home / Self Care | Payer: Self-pay | Attending: Emergency Medicine | Admitting: Emergency Medicine

## 2015-02-13 ENCOUNTER — Encounter (HOSPITAL_BASED_OUTPATIENT_CLINIC_OR_DEPARTMENT_OTHER): Payer: Self-pay

## 2015-02-13 ENCOUNTER — Emergency Department (HOSPITAL_BASED_OUTPATIENT_CLINIC_OR_DEPARTMENT_OTHER): Payer: Self-pay

## 2015-02-13 DIAGNOSIS — Z880 Allergy status to penicillin: Secondary | ICD-10-CM | POA: Insufficient documentation

## 2015-02-13 DIAGNOSIS — F329 Major depressive disorder, single episode, unspecified: Secondary | ICD-10-CM | POA: Insufficient documentation

## 2015-02-13 DIAGNOSIS — F411 Generalized anxiety disorder: Secondary | ICD-10-CM | POA: Insufficient documentation

## 2015-02-13 DIAGNOSIS — W101XXA Fall (on)(from) sidewalk curb, initial encounter: Secondary | ICD-10-CM | POA: Insufficient documentation

## 2015-02-13 DIAGNOSIS — Y9301 Activity, walking, marching and hiking: Secondary | ICD-10-CM | POA: Insufficient documentation

## 2015-02-13 DIAGNOSIS — E669 Obesity, unspecified: Secondary | ICD-10-CM | POA: Insufficient documentation

## 2015-02-13 DIAGNOSIS — Z79899 Other long term (current) drug therapy: Secondary | ICD-10-CM | POA: Insufficient documentation

## 2015-02-13 DIAGNOSIS — Y998 Other external cause status: Secondary | ICD-10-CM | POA: Insufficient documentation

## 2015-02-13 DIAGNOSIS — S93402A Sprain of unspecified ligament of left ankle, initial encounter: Secondary | ICD-10-CM | POA: Insufficient documentation

## 2015-02-13 DIAGNOSIS — Y9289 Other specified places as the place of occurrence of the external cause: Secondary | ICD-10-CM | POA: Insufficient documentation

## 2015-02-13 DIAGNOSIS — Z8679 Personal history of other diseases of the circulatory system: Secondary | ICD-10-CM | POA: Insufficient documentation

## 2015-02-13 MED ORDER — IBUPROFEN 800 MG PO TABS: 800.0000 mg | ORAL_TABLET | Freq: Three times a day (TID) | ORAL | Status: DC

## 2015-02-13 NOTE — ED Provider Notes (Signed)
CSN: [id number]     Arrival date & time 02/13/15  1210 History  By signing my name below, I, Freida Busmaniana Omoyeni, attest that this documentation has been prepared under the direction and in the presence of Glynn OctaveStephen Tyja Gortney, MD . Electronically Signed: Freida Busmaniana Omoyeni, Scribe. 02/13/2015. 1:20 PM.    Chief Complaint  Patient presents with  . Ankle Injury    The history is provided by the patient. No language interpreter was used.   HPI Comments:  Gabrielle Goodman is a 34 y.o. female who presents to the Emergency Department complaining of left ankle pain following injury this AM. Pt states she stepped off of a curb twisted the ankle and fell forward onto her hands and knees. She denies head injury and LOC. Pt notes mild abrasion to the left knee but denies pain otherwise. No alleviating factors noted. She denies chest pain, SOB, numbness, tingling weakness in her extremities.    Past Medical History  Diagnosis Date  . Migraines   . Obesity   . GAD (generalized anxiety disorder)   . Depression     effexor   Past Surgical History  Procedure Laterality Date  . Ltcs  2005  . Breast surgery  2011    Breast reduction  . Cesarean section     Family History  Problem Relation Age of Onset  . Hypertension Mother   . Hypertension Maternal Grandmother   . Diabetes Mother   . Rheum arthritis Paternal Grandmother   . Diabetes Maternal Grandmother   . Hypertension Paternal Grandmother   . Breast cancer Paternal Aunt    Social History  Substance Use Topics  . Smoking status: Never Smoker   . Smokeless tobacco: None  . Alcohol Use: No   OB History    Gravida Para Term Preterm AB TAB SAB Ectopic Multiple Living   2 1             Review of Systems  Constitutional: Negative for fever and chills.  Respiratory: Negative for shortness of breath.   Cardiovascular: Negative for chest pain.  Musculoskeletal: Positive for myalgias and arthralgias.  Neurological: Negative for syncope, weakness,  light-headedness, numbness and headaches.   Allergies  Penicillins  Home Medications   Prior to Admission medications   Medication Sig Start Date End Date Taking? Authorizing Provider  citalopram (CELEXA) 40 MG tablet TAKE 1/2 TABLET BY MOUTH DAILY FOR 1 WEEK, THEN 1 TABLET DAILY 01/13/12   Agapito Gamesatherine D Metheney, MD  citalopram (CELEXA) 40 MG tablet TAKE 1/2 TABLET BY MOUTH DAILY FOR 1 WEEK, THEN 1 TABLET DAILY 05/12/12   Agapito Gamesatherine D Metheney, MD  ibuprofen (ADVIL,MOTRIN) 800 MG tablet Take 1 tablet (800 mg total) by mouth 3 (three) times daily. 02/13/15   Glynn OctaveStephen Linh Hedberg, MD   BP 122/76 mmHg  Pulse 65  Temp(Src) 99.1 F (37.3 C) (Oral)  Resp 18  Ht 5\' 4"  (1.626 m)  Wt 273 lb (123.832 kg)  BMI 46.84 kg/m2  SpO2 100%  LMP 01/24/2015 Physical Exam  Constitutional: She is oriented to person, place, and time. She appears well-developed and well-nourished. No distress.  HENT:  Head: Normocephalic and atraumatic.  Mouth/Throat: Oropharynx is clear and moist. No oropharyngeal exudate.  Eyes: Conjunctivae and EOM are normal. Pupils are equal, round, and reactive to light.  Neck: Normal range of motion. Neck supple.  No meningismus.  Cardiovascular: Normal rate, regular rhythm, normal heart sounds and intact distal pulses.   No murmur heard. Pulmonary/Chest: Effort normal and breath sounds normal.  No respiratory distress.  Abdominal: Soft. There is no tenderness. There is no rebound and no guarding.  Musculoskeletal: Normal range of motion. She exhibits no edema or tenderness.  TTP and swelling over left lateral malleolus intact DP/PT pulses  No pain at base of fifth metatarsal No proximal fibula tenderness   Neurological: She is alert and oriented to person, place, and time. No cranial nerve deficit. She exhibits normal muscle tone. Coordination normal.  No ataxia on finger to nose bilaterally. No pronator drift. 5/5 strength throughout. CN 2-12 intact.Equal grip strength. Sensation  intact.   Skin: Skin is warm.  Psychiatric: She has a normal mood and affect. Her behavior is normal.  Nursing note and vitals reviewed.   ED Course  Procedures   DIAGNOSTIC STUDIES:  Oxygen Saturation is 100% on RA, normal by my interpretation.    COORDINATION OF CARE:  1:09 PM Pt updated with XR results.  Discussed treatment plan with pt at bedside and pt agreed to plan.  Imaging Review Dg Ankle Complete Left  02/13/2015  CLINICAL DATA:  Lateral left ankle pain following a fall today. EXAM: LEFT ANKLE COMPLETE - 3+ VIEW COMPARISON:  Left foot radiographs obtained at the same time. FINDINGS: Mild diffuse soft tissue swelling. Calcaneal spurs. No fracture, dislocation or effusion. IMPRESSION: No fracture.  Calcaneal spurs. Electronically Signed   By: Beckie Salts M.D.   On: 02/13/2015 13:08   Dg Foot Complete Left  02/13/2015  CLINICAL DATA:  Left foot and ankle pain following a fall today. EXAM: LEFT FOOT - COMPLETE 3+ VIEW COMPARISON:  Left ankle radiographs obtained at the same time. FINDINGS: Moderately large calcaneal spurs.  No fracture or dislocation. IMPRESSION: No fracture.  Calcaneal spurs. Electronically Signed   By: Beckie Salts M.D.   On: 02/13/2015 13:07   I have personally reviewed and evaluated these images and lab results as part of my medical decision-making.    MDM   Final diagnoses:  Ankle sprain, left, initial encounter   L ankle pain after twisting it while stepping off curb.  No other injuries.   neurovascularly intact.  Xrays negative for fracture. Achilles intact.  ASO, crutches, RICE, follow up with ortho.   Glynn Octave, MD 02/13/15 1725

## 2015-02-13 NOTE — ED Notes (Signed)
Patient here with left ankle and foot pain after tripping while walking this am

## 2015-02-13 NOTE — Discharge Instructions (Signed)
Ankle Sprain  An ankle sprain is an injury to the strong, fibrous tissues (ligaments) that hold the bones of your ankle joint together.   CAUSES  An ankle sprain is usually caused by a fall or by twisting your ankle. Ankle sprains most commonly occur when you step on the outer edge of your foot, and your ankle turns inward. People who participate in sports are more prone to these types of injuries.   SYMPTOMS    Pain in your ankle. The pain may be present at rest or only when you are trying to stand or walk.   Swelling.   Bruising. Bruising may develop immediately or within 1 to 2 days after your injury.   Difficulty standing or walking, particularly when turning corners or changing directions.  DIAGNOSIS   Your caregiver will ask you details about your injury and perform a physical exam of your ankle to determine if you have an ankle sprain. During the physical exam, your caregiver will press on and apply pressure to specific areas of your foot and ankle. Your caregiver will try to move your ankle in certain ways. An X-ray exam may be done to be sure a bone was not broken or a ligament did not separate from one of the bones in your ankle (avulsion fracture).   TREATMENT   Certain types of braces can help stabilize your ankle. Your caregiver can make a recommendation for this. Your caregiver may recommend the use of medicine for pain. If your sprain is severe, your caregiver may refer you to a surgeon who helps to restore function to parts of your skeletal system (orthopedist) or a physical therapist.  HOME CARE INSTRUCTIONS    Apply ice to your injury for 1-2 days or as directed by your caregiver. Applying ice helps to reduce inflammation and pain.    Put ice in a plastic bag.    Place a towel between your skin and the bag.    Leave the ice on for 15-20 minutes at a time, every 2 hours while you are awake.   Only take over-the-counter or prescription medicines for pain, discomfort, or fever as directed by  your caregiver.   Elevate your injured ankle above the level of your heart as much as possible for 2-3 days.   If your caregiver recommends crutches, use them as instructed. Gradually put weight on the affected ankle. Continue to use crutches or a cane until you can walk without feeling pain in your ankle.   If you have a plaster splint, wear the splint as directed by your caregiver. Do not rest it on anything harder than a pillow for the first 24 hours. Do not put weight on it. Do not get it wet. You may take it off to take a shower or bath.   You may have been given an elastic bandage to wear around your ankle to provide support. If the elastic bandage is too tight (you have numbness or tingling in your foot or your foot becomes cold and blue), adjust the bandage to make it comfortable.   If you have an air splint, you may blow more air into it or let air out to make it more comfortable. You may take your splint off at night and before taking a shower or bath. Wiggle your toes in the splint several times per day to decrease swelling.  SEEK MEDICAL CARE IF:    You have rapidly increasing bruising or swelling.   Your toes feel   extremely cold or you lose feeling in your foot.   Your pain is not relieved with medicine.  SEEK IMMEDIATE MEDICAL CARE IF:   Your toes are numb or blue.   You have severe pain that is increasing.  MAKE SURE YOU:    Understand these instructions.   Will watch your condition.   Will get help right away if you are not doing well or get worse.     This information is not intended to replace advice given to you by your health care provider. Make sure you discuss any questions you have with your health care provider.     Document Released: 03/27/2005 Document Revised: 04/17/2014 Document Reviewed: 04/08/2011  Elsevier Interactive Patient Education 2016 [employer]

## 2015-02-22 ENCOUNTER — Ambulatory Visit: Payer: No Typology Code available for payment source | Admitting: Family Medicine

## 2015-02-26 ENCOUNTER — Ambulatory Visit (INDEPENDENT_AMBULATORY_CARE_PROVIDER_SITE_OTHER): Payer: Self-pay | Admitting: Family Medicine

## 2015-02-26 ENCOUNTER — Encounter: Payer: Self-pay | Admitting: Family Medicine

## 2015-02-26 VITALS — BP 132/90 | HR 74 | Ht 64.0 in | Wt 273.0 lb

## 2015-02-26 DIAGNOSIS — S99912A Unspecified injury of left ankle, initial encounter: Secondary | ICD-10-CM

## 2015-02-26 NOTE — Patient Instructions (Signed)
You have a Grade 3 ankle sprain. Ice the area for 15 minutes at a time, 3-4 times a day Aleve 2 tabs twice a day with food OR ibuprofen 3 tabs three times a day with food for pain and inflammation. Elevate above the level of your heart when possible Crutches if needed to help with walking Use laceup ankle brace to help with stability while you recover from this injury. Come out of the boot/brace twice a day to do Up/down and alphabet exercises 2-3 sets of each. In about 1-2 weeks start theraband strengthening exercises - once a day 3 sets of 10. Consider physical therapy for strengthening and balance exercises. If not improving as expected, we may repeat x-rays or consider further testing like an MRI. Follow up with me in 4 weeks for reevaluation.

## 2015-03-01 DIAGNOSIS — S99912A Unspecified injury of left ankle, initial encounter: Secondary | ICD-10-CM | POA: Insufficient documentation

## 2015-03-01 NOTE — Assessment & Plan Note (Signed)
consistent with grade 3 lateral ankle sprain.  Icing, nsaids, elevation.  Continue with ASO.  Shown home exercises to do daily and advance to theraband strengthening.  F/u in 4 weeks for reevaluation.  Consider physical therapy, advanced imaging if not improving as expected.

## 2015-03-01 NOTE — Progress Notes (Signed)
PCP: Nani GasserMETHENEY,CATHERINE, MD  Subjective:   HPI: Patient is a 34 y.o. female here for left ankle injury.  Patient reports on 11/5 she stepped off a curb and inverted her left ankle. Immediate pain lateral ankle, couldn't bear weight. Had a pop. Pain with a burning quality, now 5/10 level with sitting, 7/10 with walking. Using ASO, crutches. Icing, elevating as well. No prior injuries. No skin changes, fever, other complaints.  Past Medical History  Diagnosis Date  . Migraines   . Obesity   . GAD (generalized anxiety disorder)   . Depression     effexor    Current Outpatient Prescriptions on File Prior to Visit  Medication Sig Dispense Refill  . citalopram (CELEXA) 40 MG tablet TAKE 1/2 TABLET BY MOUTH DAILY FOR 1 WEEK, THEN 1 TABLET DAILY 30 tablet 1  . ibuprofen (ADVIL,MOTRIN) 800 MG tablet Take 1 tablet (800 mg total) by mouth 3 (three) times daily. 21 tablet 0   No current facility-administered medications on file prior to visit.    Past Surgical History  Procedure Laterality Date  . Ltcs  2005  . Breast surgery  2011    Breast reduction  . Cesarean section      Allergies  Allergen Reactions  . Oxycodone-Acetaminophen Hives and Itching  . Penicillins     Social History   Social History  . Marital Status: Single    Spouse Name: N/A  . Number of Children: 1  . Years of Education: N/A   Occupational History  . medical asst    Social History Main Topics  . Smoking status: Never Smoker   . Smokeless tobacco: Not on file  . Alcohol Use: No  . Drug Use: No  . Sexual Activity: Yes    Birth Control/ Protection: None   Other Topics Concern  . Not on file   Social History Narrative   No regular exercise.  No caffeine.     Family History  Problem Relation Age of Onset  . Hypertension Mother   . Hypertension Maternal Grandmother   . Diabetes Mother   . Rheum arthritis Paternal Grandmother   . Diabetes Maternal Grandmother   . Hypertension Paternal  Grandmother   . Breast cancer Paternal Aunt     BP 132/90 mmHg  Pulse 74  Ht 5\' 4"  (1.626 m)  Wt 273 lb (123.832 kg)  BMI 46.84 kg/m2  LMP 01/24/2015  Review of Systems: See HPI above.    Objective:  Physical Exam:  Gen: NAD, comfortable in exam room.  Left ankle: Mod diffuse swelling, worst laterally. Mod limitation ROM all directions but able to do so. TTP greatest over ATFL, less lateral malleolus.  No other tenderness. 2+ ant drawer, 1+ talar tilt. Negative syndesmotic compression. Thompsons test negative. NV intact distally.  Right ankle: FROM without pain, weakness, instability.    Assessment & Plan:  1. Left ankle injury - consistent with grade 3 lateral ankle sprain.  Icing, nsaids, elevation.  Continue with ASO.  Shown home exercises to do daily and advance to theraband strengthening.  F/u in 4 weeks for reevaluation.  Consider physical therapy, advanced imaging if not improving as expected.

## 2015-03-26 ENCOUNTER — Ambulatory Visit: Payer: No Typology Code available for payment source | Admitting: Family Medicine

## 2015-04-10 ENCOUNTER — Encounter (HOSPITAL_BASED_OUTPATIENT_CLINIC_OR_DEPARTMENT_OTHER): Payer: Self-pay | Admitting: *Deleted

## 2015-04-10 ENCOUNTER — Emergency Department (HOSPITAL_BASED_OUTPATIENT_CLINIC_OR_DEPARTMENT_OTHER)
Admission: EM | Admit: 2015-04-10 | Discharge: 2015-04-10 | Disposition: A | Discharge status: Home / Self Care | Payer: BLUE CROSS/BLUE SHIELD | Attending: Emergency Medicine | Admitting: Emergency Medicine

## 2015-04-10 DIAGNOSIS — F411 Generalized anxiety disorder: Secondary | ICD-10-CM | POA: Insufficient documentation

## 2015-04-10 DIAGNOSIS — R51 Headache: Secondary | ICD-10-CM | POA: Diagnosis present

## 2015-04-10 DIAGNOSIS — J029 Acute pharyngitis, unspecified: Secondary | ICD-10-CM | POA: Insufficient documentation

## 2015-04-10 DIAGNOSIS — F329 Major depressive disorder, single episode, unspecified: Secondary | ICD-10-CM | POA: Insufficient documentation

## 2015-04-10 DIAGNOSIS — Z79899 Other long term (current) drug therapy: Secondary | ICD-10-CM | POA: Insufficient documentation

## 2015-04-10 DIAGNOSIS — Z880 Allergy status to penicillin: Secondary | ICD-10-CM | POA: Insufficient documentation

## 2015-04-10 DIAGNOSIS — E669 Obesity, unspecified: Secondary | ICD-10-CM | POA: Insufficient documentation

## 2015-04-10 DIAGNOSIS — Z8679 Personal history of other diseases of the circulatory system: Secondary | ICD-10-CM | POA: Diagnosis not present

## 2015-04-10 LAB — RAPID STREP SCREEN (MED CTR MEBANE ONLY): STREPTOCOCCUS, GROUP A SCREEN (DIRECT): NEGATIVE

## 2015-04-10 NOTE — Discharge Instructions (Signed)

## 2015-04-10 NOTE — ED Notes (Signed)
Throat appears red in color

## 2015-04-10 NOTE — ED Notes (Signed)
URI x 2 days.  Friends have all had the same symptoms.

## 2015-04-10 NOTE — ED Notes (Signed)
DC instructions reviewed with pt, stressed increasing PO fluids, comfort measures for sore throat, also importance of washing hands due to virus. Discussed when the need may arise to return to ED. Opportunity for questions provided. Teach Back Method used

## 2015-04-10 NOTE — ED Provider Notes (Signed)
CSN: [id number]     Arrival date & time 04/10/15  1649 History   First MD Initiated Contact with Patient 04/10/15 1825     Chief Complaint  Patient presents with  . URI   HPI  [name] is a 34 year old female presenting with sore throat, headache and congestion x 2 days. She reports that her throat is "a stabbing pain" which increases with swallowing. Denies difficulty handling secretions, difficulty swallowing or difficulty breathing. She has not tried any medications PTA. Her headache is a generalized throbbing pain. She has tried tylenol at home without significant improvement. She denies exacerbating or alleviating factors. Denies associated fevers, chills, dizziness, syncope, vision changes, sinus pressure, neck pain, neck stiffness, ear pain, eye pain, chest pain, SOB, cough, abdominal pain, nausea or vomiting. She did receive her flu shot this year. She also reports multiple friends at work with the same symptoms.   Past Medical History  Diagnosis Date  . Migraines   . Obesity   . GAD (generalized anxiety disorder)   . Depression     effexor   Past Surgical History  Procedure Laterality Date  . Ltcs  2005  . Breast surgery  2011    Breast reduction  . Cesarean section     Family History  Problem Relation Age of Onset  . Hypertension Mother   . Hypertension Maternal Grandmother   . Diabetes Mother   . Rheum arthritis Paternal Grandmother   . Diabetes Maternal Grandmother   . Hypertension Paternal Grandmother   . Breast cancer Paternal Aunt    Social History  Substance Use Topics  . Smoking status: Never Smoker   . Smokeless tobacco: None  . Alcohol Use: No   OB History    Gravida Para Term Preterm AB TAB SAB Ectopic Multiple Living   2 1             Review of Systems  Constitutional: Negative for fever and chills.  HENT: Positive for congestion and sore throat. Negative for postnasal drip, rhinorrhea, sinus pressure, trouble swallowing and voice change.    Eyes: Negative for discharge and visual disturbance.  Respiratory: Negative for cough and shortness of breath.   Cardiovascular: Negative for chest pain.  Gastrointestinal: Negative for nausea, vomiting and abdominal pain.  Musculoskeletal: Negative for myalgias.  Skin: Negative for rash.  Neurological: Positive for headaches. Negative for dizziness, syncope, weakness and light-headedness.  All other systems reviewed and are negative.     Allergies  Oxycodone-acetaminophen and Penicillins  Home Medications   Prior to Admission medications   Medication Sig Start Date End Date Taking? Authorizing Provider  citalopram (CELEXA) 40 MG tablet TAKE 1/2 TABLET BY MOUTH DAILY FOR 1 WEEK, THEN 1 TABLET DAILY 05/12/12   Agapito Gamesatherine D Metheney, MD  ibuprofen (ADVIL,MOTRIN) 800 MG tablet Take 1 tablet (800 mg total) by mouth 3 (three) times daily. 02/13/15   Glynn OctaveStephen Rancour, MD   BP 126/76 mmHg  Pulse 81  Temp(Src) 99.2 F (37.3 C) (Oral)  Resp 18  Ht 5\' 4"  (1.626 m)  Wt 124.739 kg  BMI 47.18 kg/m2  SpO2 100%  LMP 04/09/2015 Physical Exam  Constitutional: She is oriented to person, place, and time. She appears well-developed and well-nourished. No distress.  HENT:  Head: Normocephalic and atraumatic.  Nose: Nose normal. Right sinus exhibits no maxillary sinus tenderness and no frontal sinus tenderness. Left sinus exhibits no maxillary sinus tenderness and no frontal sinus tenderness.  Mouth/Throat: Uvula is midline and  mucous membranes are normal. Mucous membranes are not dry. No uvula swelling. Posterior oropharyngeal erythema present. No oropharyngeal exudate, posterior oropharyngeal edema or tonsillar abscesses.  No trismus  Eyes: Conjunctivae and EOM are normal. Pupils are equal, round, and reactive to light. Right eye exhibits no discharge. Left eye exhibits no discharge. No scleral icterus.  Neck: Normal range of motion. Neck supple.  Cardiovascular: Normal rate, regular rhythm and  normal heart sounds.   Pulmonary/Chest: Effort normal and breath sounds normal. No respiratory distress. She has no wheezes. She has no rales.  Abdominal: Soft. There is no tenderness.  Musculoskeletal: Normal range of motion.  Pt moves all extremities spontaneously and walks with a steady gait  Lymphadenopathy:    She has no cervical adenopathy.  Neurological: She is alert and oriented to person, place, and time. No cranial nerve deficit. Coordination normal.  Cranial nerves 3-12 intact. Major muscle groups with 5/5 motor strength. Sensation to light touch intact. Coordinated finger to nose. Walks with a steady gait.   Skin: Skin is warm and dry.  Psychiatric: She has a normal mood and affect. Her behavior is normal.  Nursing note and vitals reviewed.   ED Course  Procedures (including critical care time) Labs Review Labs Reviewed  RAPID STREP SCREEN (NOT AT [facility])  CULTURE, GROUP A STREP    Imaging Review No results found. I have personally reviewed and evaluated these images and lab results as part of my medical decision-making.   EKG Interpretation None      MDM   Final diagnoses:  Viral pharyngitis   Pt presenting with sore throat and odynophagia. VSS. Oropharyngeal erythema without exudate. No uvular edema or deviation. No trismus. No cervical lymphadenopathy. Negative strep. Likely viral pharyngitis. No abx indicated. DC w symptomatic tx for pain to include OTC pain relievers (tylenol, motrin) and chloraseptic spray.  Pt does not appear dehydrated, but did discuss importance of water rehydration. Presentation not concerning for PTA or infxn spread to soft tissues of the neck. Pt able to tolerate PO intake in the ED. Return precautions given in discharge paperwork and discussed with pt at bedside. Pt stable for discharge      Alveta Heimlich, PA-C 04/10/15 1937  Azalia Bilis, MD 04/10/15 [phone]

## 2015-04-10 NOTE — ED Notes (Signed)
Pt presents today with sore throat, onset this past Thursday, took tylenol and not improving, now having increased sore throat and HA.

## 2015-04-14 LAB — CULTURE, GROUP A STREP: Strep A Culture: NEGATIVE

## 2015-07-01 ENCOUNTER — Encounter (HOSPITAL_BASED_OUTPATIENT_CLINIC_OR_DEPARTMENT_OTHER): Payer: Self-pay | Admitting: *Deleted

## 2015-07-01 ENCOUNTER — Emergency Department (HOSPITAL_BASED_OUTPATIENT_CLINIC_OR_DEPARTMENT_OTHER)
Admission: EM | Admit: 2015-07-01 | Discharge: 2015-07-01 | Disposition: A | Discharge status: Home / Self Care | Payer: BLUE CROSS/BLUE SHIELD | Attending: Emergency Medicine | Admitting: Emergency Medicine

## 2015-07-01 DIAGNOSIS — E669 Obesity, unspecified: Secondary | ICD-10-CM | POA: Insufficient documentation

## 2015-07-01 DIAGNOSIS — R51 Headache: Secondary | ICD-10-CM | POA: Diagnosis present

## 2015-07-01 DIAGNOSIS — F329 Major depressive disorder, single episode, unspecified: Secondary | ICD-10-CM | POA: Diagnosis not present

## 2015-07-01 DIAGNOSIS — F419 Anxiety disorder, unspecified: Secondary | ICD-10-CM | POA: Insufficient documentation

## 2015-07-01 DIAGNOSIS — Z79818 Long term (current) use of other agents affecting estrogen receptors and estrogen levels: Secondary | ICD-10-CM | POA: Insufficient documentation

## 2015-07-01 DIAGNOSIS — M792 Neuralgia and neuritis, unspecified: Secondary | ICD-10-CM

## 2015-07-01 DIAGNOSIS — Z791 Long term (current) use of non-steroidal anti-inflammatories (NSAID): Secondary | ICD-10-CM | POA: Diagnosis not present

## 2015-07-01 DIAGNOSIS — Z8679 Personal history of other diseases of the circulatory system: Secondary | ICD-10-CM | POA: Diagnosis not present

## 2015-07-01 DIAGNOSIS — Z880 Allergy status to penicillin: Secondary | ICD-10-CM | POA: Diagnosis not present

## 2015-07-01 MED ORDER — NAPROXEN 500 MG PO TABS: 500.0000 mg | ORAL_TABLET | Freq: Two times a day (BID) | ORAL | Status: DC

## 2015-07-01 NOTE — ED Notes (Signed)
Pt reports pain on right side of head for 2 weeks, worsening with time.  Pain just above right ear, worse with palpation.  Pt denies any pain with chewing but states pain slightly worse with laughing at times.  Pt denies any vision changes or nausea.  + history of migraines but states this pain is very different.

## 2015-07-01 NOTE — ED Provider Notes (Signed)
CSN: [id number]     Arrival date & time 07/01/15  1719 History  By signing my name below, I, Gabrielle Goodman, attest that this documentation has been prepared under the direction and in the presence of Arby Barrette, MD . Electronically Signed: Freida Goodman, Scribe. 07/01/2015. 7:42 PM.  Chief Complaint  Patient presents with  . Headache   The history is provided by the patient. No language interpreter was used.    HPI Comments:  Gabrielle Goodman is a 35 y.o. female with a history of migraine HA, who presents to the Emergency Department complaining of a right sided HA x ~ 2 weeks, worse this AM. Pt states she was combing her hair when she first felt the pain.  Pt notes the area is also painful to touch and she reports mild exacerbation of pain when she laughs. She also reports constant throbbing and tingling pain to the site. Pt has taken imitrex without relief.She denies fever, chills, vision changes, nausea, sinus congestion,  ear pain and weakness/numbness.   Past Medical History  Diagnosis Date  . Migraines   . Obesity   . GAD (generalized anxiety disorder)   . Depression     effexor   Past Surgical History  Procedure Laterality Date  . Ltcs  2005  . Breast surgery  2011    Breast reduction  . Cesarean section     Family History  Problem Relation Age of Onset  . Hypertension Mother   . Hypertension Maternal Grandmother   . Diabetes Mother   . Rheum arthritis Paternal Grandmother   . Diabetes Maternal Grandmother   . Hypertension Paternal Grandmother   . Breast cancer Paternal Aunt    Social History  Substance Use Topics  . Smoking status: Never Smoker   . Smokeless tobacco: None  . Alcohol Use: No   OB History    Gravida Para Term Preterm AB TAB SAB Ectopic Multiple Living   2 1             Review of Systems  10 systems reviewed and all are negative for acute change except as noted in the HPI.  Allergies  Oxycodone-acetaminophen and Penicillins  Home  Medications   Prior to Admission medications   Medication Sig Start Date End Date Taking? Authorizing Provider  norethindrone-ethinyl estradiol (MICROGESTIN,JUNEL,LOESTRIN) 1-20 MG-MCG tablet Take 1 tablet by mouth daily.   Yes Historical Provider, MD  citalopram (CELEXA) 40 MG tablet TAKE 1/2 TABLET BY MOUTH DAILY FOR 1 WEEK, THEN 1 TABLET DAILY 05/12/12   Agapito Games, MD  ibuprofen (ADVIL,MOTRIN) 800 MG tablet Take 1 tablet (800 mg total) by mouth 3 (three) times daily. 02/13/15   Glynn Octave, MD  naproxen (NAPROSYN) 500 MG tablet Take 1 tablet (500 mg total) by mouth 2 (two) times daily. 07/01/15   Arby Barrette, MD   BP 126/80 mmHg  Pulse 71  Temp(Src) 99.2 F (37.3 C) (Oral)  Resp 18  Ht  (1.626 m)  Wt 270 lb (122.471 kg)  BMI 46.32 kg/m2  SpO2 100%  LMP 05/26/2015 Physical Exam  Constitutional: She is oriented to person, place, and time. She appears well-developed and well-nourished.  HENT:  Head: Normocephalic and atraumatic.  Right Ear: External ear normal.  Left Ear: External ear normal.  Nose: Nose normal.  Mouth/Throat: Oropharynx is clear and moist.  Bilateral TMs are normal. Section of the scalp in the ear on the right side of the head are normal there are no visible lesions  swelling. No Lesions on the internal auditory canal. Patient endorses tenderness to palpation of the soft tissues in the parietal region of the scalp just above the pinna. No palpable abnormality. This pain is not over her temple.   Eyes: EOM are normal. Pupils are equal, round, and reactive to light.  Neck: Neck supple.  Cardiovascular: Normal rate, regular rhythm, normal heart sounds and intact distal pulses.   Pulmonary/Chest: Effort normal and breath sounds normal.  Abdominal: Soft. Bowel sounds are normal. She exhibits no distension. There is no tenderness.  Musculoskeletal: Normal range of motion. She exhibits no edema.  Neurological: She is alert and oriented to person, place,  and time. She has normal strength. Coordination normal. GCS eye subscore is 4. GCS verbal subscore is 5. GCS motor subscore is 6.  Skin: Skin is warm, dry and intact.  Psychiatric: She has a normal mood and affect.    ED Course  Procedures   DIAGNOSTIC STUDIES:  Oxygen Saturation is 100% on RA, normal by my interpretation.    COORDINATION OF CARE:  7:36 PM Discussed treatment plan with pt at bedside and pt agreed to plan.  Labs Review Labs Reviewed - No data to display  Imaging Review No results found. I have personally reviewed and evaluated these images and lab results as part of my medical decision-making.   MDM   Final diagnoses:  Neuralgia   Patient is a well with a focal area of pain on her scalp behind her ear. The pain is very intense and lancinating. At this time this appears most consistent with a neuralgia. It does not include her face but the quality and severity is suggestive of a trigeminal neuralgia. Her logic examination is normal. There are no lesions to suggest herpetic infection. Patient will be treated for pain with naproxen and counseled to follow up with her family provider.   Arby BarretteMarcy Jesica Goheen, MD 07/05/15 [phone]

## 2015-07-01 NOTE — Discharge Instructions (Signed)
Possible Neuropathic Pain Neuropathic pain is pain caused by damage to the nerves that are responsible for certain sensations in your body (sensory nerves). The pain can be caused by damage to:   The sensory nerves that send signals to your spinal cord and brain (peripheral nervous system).  The sensory nerves in your brain or spinal cord (central nervous system). Neuropathic pain can make you more sensitive to pain. What would be a minor sensation for most people may feel very painful if you have neuropathic pain. This is usually a long-term condition that can be difficult to treat. The type of pain can differ from person to person. It may start suddenly (acute), or it may develop slowly and last for a long time (chronic). Neuropathic pain may come and go as damaged nerves heal or may stay at the same level for years. It often causes emotional distress, loss of sleep, and a lower quality of life. CAUSES  The most common cause of damage to a sensory nerve is diabetes. Many other diseases and conditions can also cause neuropathic pain. Causes of neuropathic pain can be classified as:  Toxic. Many drugs and chemicals can cause toxic damage. The most common cause of toxic neuropathic pain is damage from drug treatment for cancer (chemotherapy).  Metabolic. This type of pain can happen when a disease causes imbalances that damage nerves. Diabetes is the most common of these diseases. Vitamin B deficiency caused by long-term alcohol abuse is another common cause.  Traumatic. Any injury that cuts, crushes, or stretches a nerve can cause damage and pain. A common example is feeling pain after losing an arm or leg (phantom limb pain).  Compression-related. If a sensory nerve gets trapped or compressed for a long period of time, the blood supply to the nerve can be cut off.  Vascular. Many blood vessel diseases can cause neuropathic pain by decreasing blood supply and oxygen to nerves.  Autoimmune. This  type of pain results from diseases in which the body's defense system mistakenly attacks sensory nerves. Examples of autoimmune diseases that can cause neuropathic pain include lupus and multiple sclerosis.  Infectious. Many types of viral infections can damage sensory nerves and cause pain. Shingles infection is a common cause of this type of pain.  Inherited. Neuropathic pain can be a symptom of many diseases that are passed down through families (genetic). SIGNS AND SYMPTOMS  The main symptom is pain. Neuropathic pain is often described as:  Burning.  Shock-like.  Stinging.  Hot or cold.  Itching. DIAGNOSIS  No single test can diagnose neuropathic pain. Your health care provider will do a physical exam and ask you about your pain. You may use a pain scale to describe how bad your pain is. You may also have tests to see if you have a high sensitivity to pain and to help find the cause and location of any sensory nerve damage. These tests may include:  Imaging studies, such as:  X-rays.  CT scan.  MRI.  Nerve conduction studies to test how well nerve signals travel through your sensory nerves (electrodiagnostic testing).  Stimulating your sensory nerves through electrodes on your skin and measuring the response in your spinal cord and brain (somatosensory evoked potentials). TREATMENT  Treatment for neuropathic pain may change over time. You may need to try different treatment options or a combination of treatments. Some options include:  Over-the-counter pain relievers.  Prescription medicines. Some medicines used to treat other conditions may also help neuropathic pain.  These include medicines to:  Control seizures (anticonvulsants).  Relieve depression (antidepressants).  Prescription-strength pain relievers (narcotics). These are usually used when other pain relievers do not help.  Transcutaneous nerve stimulation (TENS). This uses electrical currents to block painful  nerve signals. The treatment is painless.  Topical and local anesthetics. These are medicines that numb the nerves. They can be injected as a nerve block or applied to the skin.  Alternative treatments, such as:  Acupuncture.  Meditation.  Massage.  Physical therapy.  Pain management programs.  Counseling. HOME CARE INSTRUCTIONS  Learn as much as you can about your condition.  Take medicines only as directed by your health care provider.  Work closely with all your health care providers to find what works best for you.  Have a good support system at home.  Consider joining a chronic pain support group. SEEK MEDICAL CARE IF:  Your pain treatments are not helping.  You are having side effects from your medicines.  You are struggling with fatigue, mood changes, depression, or anxiety.   This information is not intended to replace advice given to you by your health care provider. Make sure you discuss any questions you have with your health care provider.   Document Released: 12/23/2003 Document Revised: 04/17/2014 Document Reviewed: 09/04/2013 Elsevier Interactive Patient Education [employer]

## 2015-07-01 NOTE — ED Notes (Signed)
Pain to the right side of her head x 2 weeks.

## 2015-07-08 ENCOUNTER — Ambulatory Visit: Payer: BLUE CROSS/BLUE SHIELD | Admitting: Family Medicine

## 2015-12-06 ENCOUNTER — Emergency Department (HOSPITAL_BASED_OUTPATIENT_CLINIC_OR_DEPARTMENT_OTHER)
Admission: EM | Admit: 2015-12-06 | Discharge: 2015-12-06 | Disposition: A | Discharge status: Home / Self Care | Payer: Managed Care, Other (non HMO) | Attending: Emergency Medicine | Admitting: Emergency Medicine

## 2015-12-06 ENCOUNTER — Encounter (HOSPITAL_BASED_OUTPATIENT_CLINIC_OR_DEPARTMENT_OTHER): Payer: Self-pay | Admitting: Emergency Medicine

## 2015-12-06 DIAGNOSIS — Z79818 Long term (current) use of other agents affecting estrogen receptors and estrogen levels: Secondary | ICD-10-CM | POA: Insufficient documentation

## 2015-12-06 DIAGNOSIS — J020 Streptococcal pharyngitis: Secondary | ICD-10-CM

## 2015-12-06 DIAGNOSIS — Z79899 Other long term (current) drug therapy: Secondary | ICD-10-CM | POA: Diagnosis not present

## 2015-12-06 DIAGNOSIS — J029 Acute pharyngitis, unspecified: Secondary | ICD-10-CM | POA: Insufficient documentation

## 2015-12-06 LAB — RAPID STREP SCREEN (MED CTR MEBANE ONLY): Streptococcus, Group A Screen (Direct): POSITIVE — AB

## 2015-12-06 MED ORDER — AZITHROMYCIN 250 MG PO TABS
500.0000 mg | ORAL_TABLET | Freq: Once | ORAL | Status: AC
  Administered 2015-12-06: 500 mg via ORAL
  Filled 2015-12-06: qty 2

## 2015-12-06 MED ORDER — AZITHROMYCIN 250 MG PO TABS: 500.0000 mg | ORAL_TABLET | Freq: Every day | ORAL | 0 refills | Status: AC

## 2015-12-06 NOTE — ED Provider Notes (Signed)
[facility]   CSN: [id number] Arrival date & time: 12/06/15  2030  By signing my name below, I, Phillis HaggisGabriella Gaje, attest that this documentation has been prepared under the direction and in the presence of Mattie MarlinJessica Erasto Sleight, PA-C. Electronically Signed: Phillis HaggisGabriella Gaje, ED Scribe. 12/06/15. 10:21 PM.  History   Chief Complaint Chief Complaint  Patient presents with  . Sore Throat   The history is provided by the patient. No language interpreter was used.   HPI Comments: Gabrielle Goodman is a 35 y.o. female who presents to the Emergency Department complaining of a gradually worsening, sharp sore throat that radiates to the right ear onset 1 day ago. Pt reports associated headache that began two days ago and nausea. Pt reports hx of the same and states that she typically gets strep throat once a year. Pt reports worsening pain with swallowing. She has not taken steroids or magic mouthwash in the past for strep throat. Pt has not tried anything for her symptoms. She denies fever, chills, or vomiting.   Past Medical History:  Diagnosis Date  . Depression    effexor  . GAD (generalized anxiety disorder)   . Migraines   . Obesity     Patient Active Problem List   Diagnosis Date Noted  . Left ankle injury 03/01/2015  . Migraine 10/20/2010  . OBESITY 06/03/2010  . Anxiety state, unspecified 06/03/2010  . SLEEP APNEA 06/03/2010  . UNSPECIFIED VITAMIN D DEFICIENCY 05/10/2010    Past Surgical History:  Procedure Laterality Date  . BREAST SURGERY  2011   Breast reduction  . CESAREAN SECTION    . LTCS  2005    OB History    Gravida Para Term Preterm AB Living   2 1           SAB TAB Ectopic Multiple Live Births                   Home Medications    Prior to Admission medications   Medication Sig Start Date End Date Taking? Authorizing Provider  amphetamine-dextroamphetamine (ADDERALL XR) 10 MG 24 hr capsule Take 10 mg by mouth daily.   Yes Historical  Provider, MD  norethindrone-ethinyl estradiol-iron (MICROGESTIN FE,GILDESS FE,LOESTRIN FE) 1.5-30 MG-MCG tablet Take 1 tablet by mouth daily.   Yes Historical Provider, MD  azithromycin (ZITHROMAX) 250 MG tablet Take 2 tablets (500 mg total) by mouth daily. Take first 2 tablets together, then 1 every day until finished. 12/06/15 12/10/15  Jerre SimonJessica L Tifani Dack, PA  citalopram (CELEXA) 40 MG tablet TAKE 1/2 TABLET BY MOUTH DAILY FOR 1 WEEK, THEN 1 TABLET DAILY 05/12/12   Agapito Gamesatherine D Metheney, MD  ibuprofen (ADVIL,MOTRIN) 800 MG tablet Take 1 tablet (800 mg total) by mouth 3 (three) times daily. 02/13/15   Glynn OctaveStephen Rancour, MD  naproxen (NAPROSYN) 500 MG tablet Take 1 tablet (500 mg total) by mouth 2 (two) times daily. 07/01/15   Arby BarretteMarcy Pfeiffer, MD  norethindrone-ethinyl estradiol (MICROGESTIN,JUNEL,LOESTRIN) 1-20 MG-MCG tablet Take 1 tablet by mouth daily.    Historical Provider, MD    Family History Family History  Problem Relation Age of Onset  . Hypertension Mother   . Diabetes Mother   . Hypertension Maternal Grandmother   . Diabetes Maternal Grandmother   . Rheum arthritis Paternal Grandmother   . Hypertension Paternal Grandmother   . Breast cancer Paternal Aunt     Social History Social History  Substance Use Topics  . Smoking status: Never Smoker  .  Smokeless tobacco: Never Used  . Alcohol use No     Allergies   Oxycodone-acetaminophen and Penicillins   Review of Systems Review of Systems  Constitutional: Negative for chills and fever.  HENT: Positive for sore throat.   Gastrointestinal: Positive for nausea. Negative for vomiting.  Neurological: Positive for headaches.     Physical Exam Updated Vital Signs BP 128/72 (BP Location: Right Arm)   Pulse 81   Temp 99.1 F (37.3 C) (Oral)   Resp 18   Ht 5\' 4"  (1.626 m)   Wt 270 lb (122.5 kg)   LMP 10/06/2015   SpO2 100%   BMI 46.35 kg/m   Physical Exam  Constitutional: She appears well-developed and well-nourished. No  distress.  HENT:  Head: Normocephalic and atraumatic.  Right Ear: Tympanic membrane normal.  Left Ear: Tympanic membrane normal.  Mouth/Throat: Uvula is midline and mucous membranes are normal. No trismus in the jaw. No uvula swelling. Posterior oropharyngeal edema and posterior oropharyngeal erythema present. No oropharyngeal exudate or tonsillar abscesses.  Eyes: Conjunctivae are normal.  Cardiovascular: Normal rate and regular rhythm.   Pulmonary/Chest: Effort normal and breath sounds normal. No respiratory distress.  Musculoskeletal: Normal range of motion.  Lymphadenopathy:       Head (right side): Tonsillar adenopathy present.       Head (left side): Tonsillar adenopathy present.    She has no cervical adenopathy.  Neurological: She is alert. Coordination normal.  Skin: Skin is warm and dry. She is not diaphoretic.  Psychiatric: She has a normal mood and affect. Her behavior is normal.  Nursing note and vitals reviewed.    ED Treatments / Results  DIAGNOSTIC STUDIES: Oxygen Saturation is 100% on RA, normal by my interpretation.    COORDINATION OF CARE: 10:20 PM-Discussed treatment plan which includes strep screen and antibiotics with pt at bedside and pt agreed to plan.    Labs (all labs ordered are listed, but only abnormal results are displayed) Labs Reviewed  RAPID STREP SCREEN (NOT AT [facility]) - Abnormal; Notable for the following:       Result Value   Streptococcus, Group A Screen (Direct) POSITIVE (*)    All other components within normal limits    EKG  EKG Interpretation None       Radiology No results found.  Procedures Procedures (including critical care time)  Medications Ordered in ED Medications  azithromycin (ZITHROMAX) tablet 500 mg (not administered)     Initial Impression / Assessment and Plan / ED Course  I have reviewed the triage vital signs and the nursing notes.  Pertinent labs & imaging results that were available during my care of  the patient were reviewed by me and considered in my medical decision making (see chart for details).  Clinical Course    Pt febrile with tonsillar erythema and edema & dysphagia; diagnosis of strep. Positive rapid strep.Treated in the Ed with azithromycin.D/c with azithromycin.  Pt appears mildly dehydrated, discussed importance of water rehydration. Presentation non concerning for PTA or infxn spread to soft tissue. No trismus or uvula deviation. Specific return precautions discussed. Pt able to drink water in ED without difficulty with intact air way. Recommended PCP follow up.   I personally performed the services described in this documentation, which was scribed in my presence. The recorded information has been reviewed and is accurate.    Final Clinical Impressions(s) / ED Diagnoses   Final diagnoses:  Strep throat    New Prescriptions New Prescriptions  AZITHROMYCIN (ZITHROMAX) 250 MG TABLET    Take 2 tablets (500 mg total) by mouth daily. Take first 2 tablets together, then 1 every day until finished.     Jerre Simon, PA 12/06/15 1610    Rolan Bucco, MD 12/06/15 [phone]

## 2015-12-06 NOTE — ED Notes (Signed)
PA at bedside.

## 2015-12-06 NOTE — Discharge Instructions (Signed)
Take the Azithromycin as prescribed and be sure to complete the entire 4 day course. You were given one days worth in the ED. Follow-up with the [facility] in 2 days if your symptoms are not improving.  Return to emergency department if you experience worsening sore throat, swelling of your throat, difficulty swallowing, difficulty opening your mouth, swelling of your throat, fever, or any other concerning symptoms.

## 2015-12-06 NOTE — ED Triage Notes (Signed)
Patient states that she had some headache last night and then started to have sore throat today

## 2016-09-09 IMAGING — CR DG FOOT COMPLETE 3+V*L*
3 series · 3 of 3 positions shown · non-contrast
Comparison: Left ankle radiographs obtained at the same time.

CLINICAL DATA: Left foot and ankle pain following a fall today.

EXAM:
LEFT FOOT - COMPLETE 3+ VIEW

[t foot lat left]
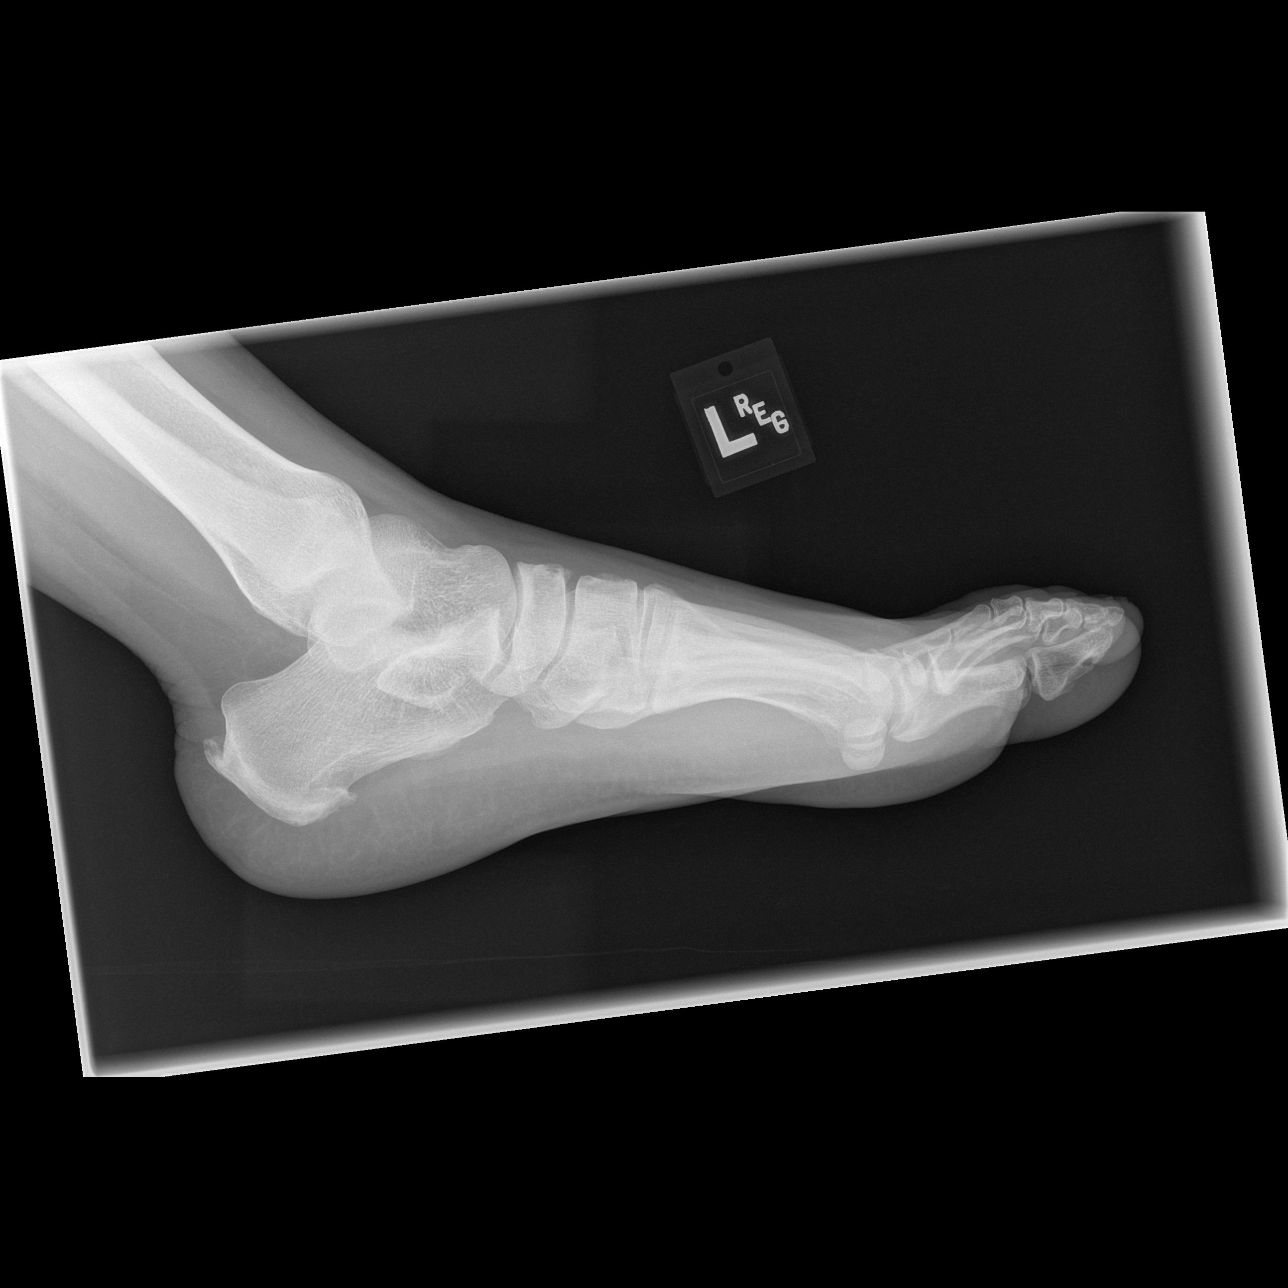

[t foot ap left]
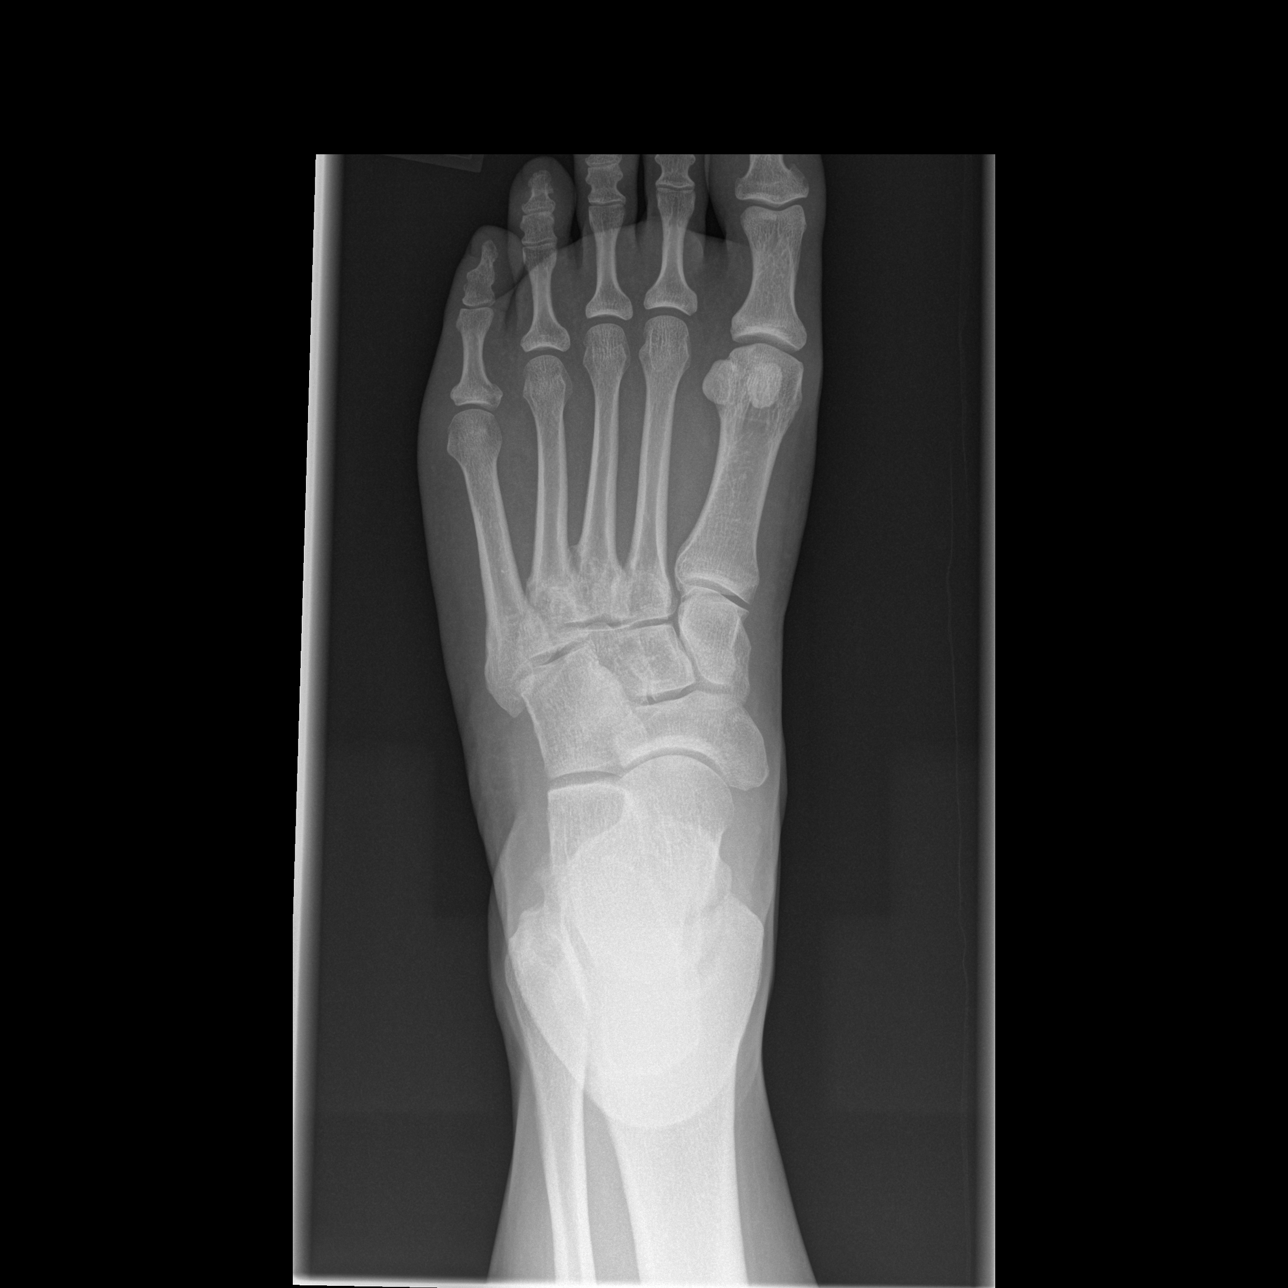

[t foot oblique left]
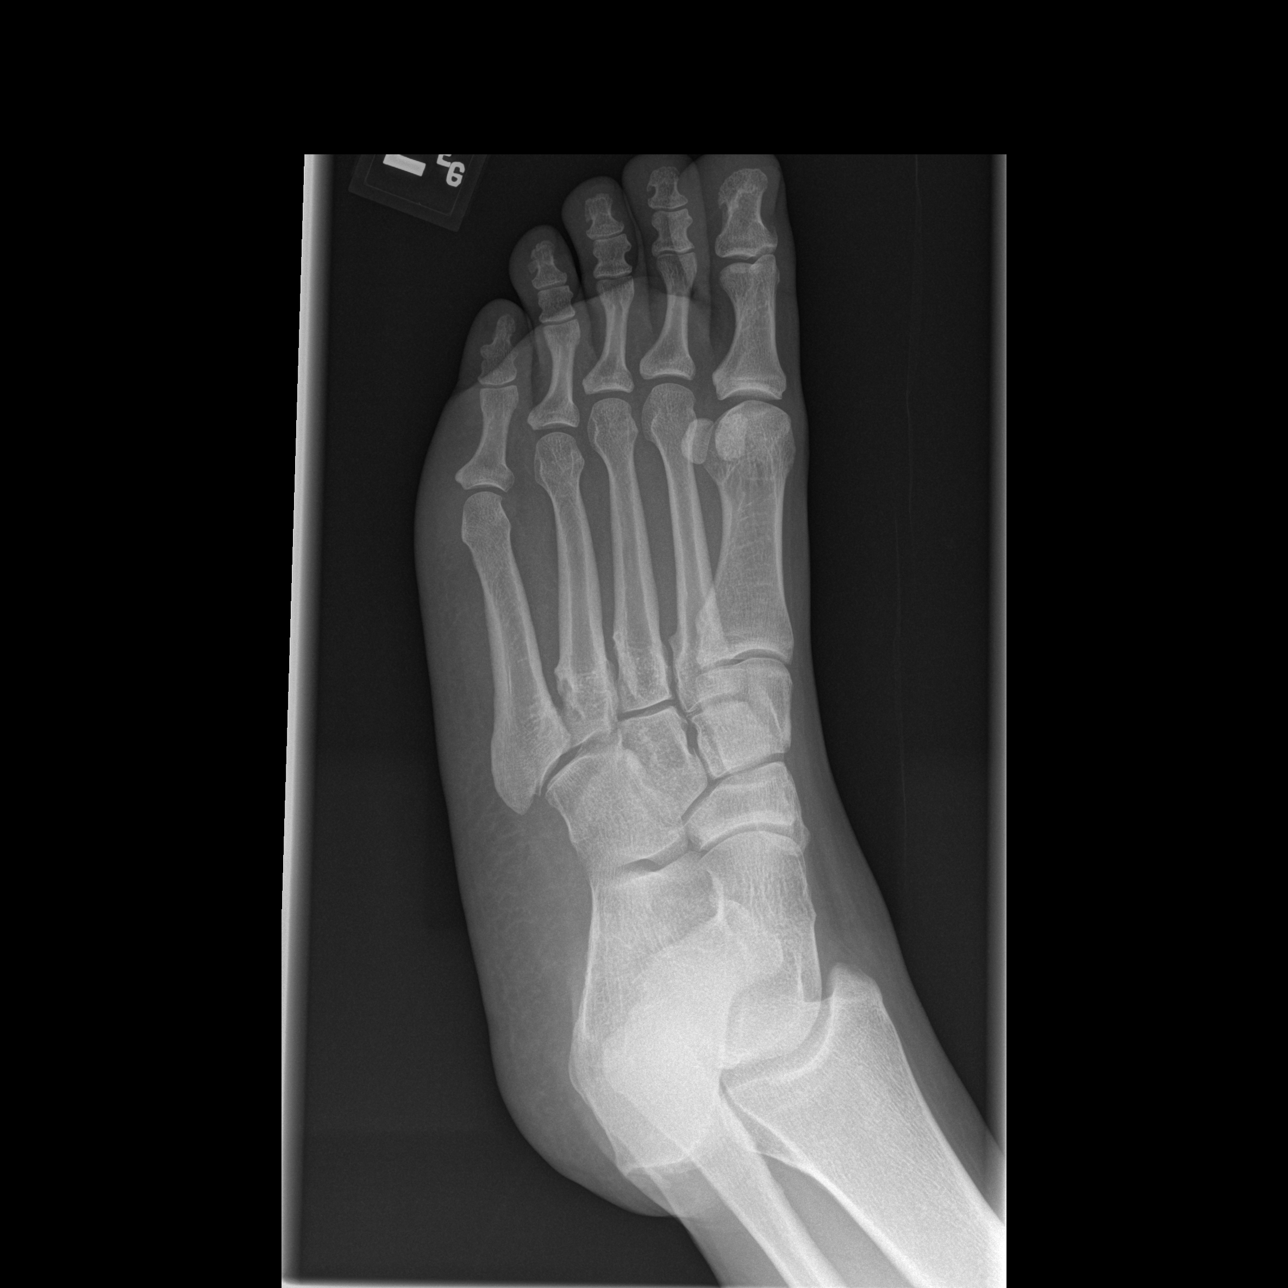

[3 of 3 positions shown; findings below may reference images not displayed]

FINDINGS: Moderately large calcaneal spurs.  No fracture or dislocation.
IMPRESSION: No fracture.  Calcaneal spurs.

## 2018-10-03 DIAGNOSIS — N760 Acute vaginitis: Secondary | ICD-10-CM | POA: Diagnosis not present

## 2018-10-03 DIAGNOSIS — Z113 Encounter for screening for infections with a predominantly sexual mode of transmission: Secondary | ICD-10-CM | POA: Diagnosis not present

## 2018-10-03 DIAGNOSIS — Z1151 Encounter for screening for human papillomavirus (HPV): Secondary | ICD-10-CM | POA: Diagnosis not present

## 2018-10-03 DIAGNOSIS — Z124 Encounter for screening for malignant neoplasm of cervix: Secondary | ICD-10-CM | POA: Diagnosis not present

## 2018-10-03 DIAGNOSIS — Z01419 Encounter for gynecological examination (general) (routine) without abnormal findings: Secondary | ICD-10-CM | POA: Diagnosis not present

## 2018-11-11 DIAGNOSIS — F988 Other specified behavioral and emotional disorders with onset usually occurring in childhood and adolescence: Secondary | ICD-10-CM | POA: Diagnosis not present

## 2018-11-11 DIAGNOSIS — M25562 Pain in left knee: Secondary | ICD-10-CM | POA: Diagnosis not present

## 2018-11-12 DIAGNOSIS — F988 Other specified behavioral and emotional disorders with onset usually occurring in childhood and adolescence: Secondary | ICD-10-CM | POA: Diagnosis not present

## 2018-11-12 DIAGNOSIS — M25562 Pain in left knee: Secondary | ICD-10-CM | POA: Diagnosis not present

## 2018-11-26 MED FILL — GABAPENTIN 100 MG CAPSULE: 100 | 68 days supply | Qty: 90 | Fill #0

## 2018-11-26 MED FILL — AMITRIPTYLINE HCL 25 MG TAB: 25 | 60 days supply | Qty: 60 | Fill #0

## 2018-12-01 MED FILL — LARIN FE 1.5-30 TABLET: 1.5-30 | 84 days supply | Qty: 112 | Fill #0

## 2018-12-03 MED FILL — AMPHETAMINE-DEXTROAMPHETAMI: 20 | 30 days supply | Qty: 30 | Fill #0

## 2018-12-20 MED FILL — CITALOPRAM HBR 40 MG TABLET: 40 | 90 days supply | Qty: 90 | Fill #0

## 2018-12-27 ENCOUNTER — Ambulatory Visit: Payer: BLUE CROSS/BLUE SHIELD | Admitting: Orthopaedic Surgery

## 2019-01-03 MED FILL — FLUCONAZOLE 150 MG TABS: 150 | 9 days supply | Qty: 3 | Fill #0

## 2019-01-06 MED FILL — AMPHETAMINE-DEXTROAMPHETAMI: 20 | 30 days supply | Qty: 30 | Fill #0

## 2019-01-13 ENCOUNTER — Ambulatory Visit (INDEPENDENT_AMBULATORY_CARE_PROVIDER_SITE_OTHER): Payer: 59 | Admitting: Licensed Clinical Social Worker

## 2019-01-13 DIAGNOSIS — F331 Major depressive disorder, recurrent, moderate: Secondary | ICD-10-CM

## 2019-01-13 DIAGNOSIS — F411 Generalized anxiety disorder: Secondary | ICD-10-CM

## 2019-01-13 NOTE — Progress Notes (Signed)
Comprehensive Clinical Assessment (CCA) Note  01/13/2019 Gabrielle Goodman [id number]  Visit Diagnosis:      ICD-10-CM   1. Major depressive disorder, recurrent episode, moderate (HCC)  F33.1   2. Generalized anxiety disorder  F41.1       CCA Part One  Part One has been completed on paper by the patient.  (See scanned document in Chart Review)  CCA Part Two A  Intake/Chief Complaint:  CCA Intake With Chief Complaint CCA Part Two Date: 01/13/19 CCA Part Two Time: 1505 Chief Complaint/Presenting Problem: depression and anxiety since born and he is 38 now. Several different medications to help Celexa 40 mg for awhile. Depression is getting worse, especially on weekend when don't work. Over the past 6 months increased in severity. Gabrielle RomansPCP-Gabrielle Goodman with [facility] Patients Currently Reported Symptoms/Problems: depression and anxiety, stress Collateral Involvement: supports-I really don't have anybody, parents with their age and mindset think you can shut off and on and not an issues. 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 Love the beach in the summer time. Type of Services Patient Feels Are Needed: therapy, psychiatric evaluation Initial Clinical Notes/Concerns: treatment history-Prozac started on, tried Wellbutrin, Cymbalta.family history-dad side of the father his siblings have alcoholism. Medical issues-sleep apnea, ADD-Adderall 20 mg. diagnosed with it a year ago,  Mental Health Symptoms Depression:  Depression: Change in energy/activity, Hopelessness, Weight gain/loss, Increase/decrease in appetite, Irritability, Fatigue, Sleep (too much or little), Tearfulness, Worthlessness(want to do it, but body won't let me and have to  force myself, single parent of 2 boys, something else could do better when it comes to parenting. happen once suicidal when doctor tried to change my medicine)  Mania:  Mania: N/A  Anxiety:   Anxiety: Fatigue, Irritability, Sleep, Worrying(worrying impacts sleep, Amitriptyline at night helps to shut my brain down so not thinking at night. If not busy worse, had to go in bathroom at work at times due to anxiety. Causes distress, impacts functioning 5/7 days)  Psychosis:  Psychosis: N/A  Trauma:  Trauma: N/A  Obsessions:  Obsessions: N/A  Compulsions:  Compulsions: N/A(do more so at work. things at work do it a certain way. If thrown out of routine bothers me really.)  Inattention:  Inattention: N/A(ADD-med medication helped, doesn't take it the weekend not unless something that requires a lot of concentration)  Hyperactivity/Impulsivity:  Hyperactivity/Impulsivity: N/A  Oppositional/Defiant Behaviors:  Oppositional/Defiant Behaviors: N/A  Borderline Personality:  Emotional Irregularity: N/A  Other Mood/Personality Symptoms:  Other Mood/Personality Symptoms: Depression-SI passive, sleep apnea-C-PAP could sleep all day, over eat at times when depressed and stressed Anxiety-worries about everything. Worries about children at work, job performance, worry about weight do something doesn't follow through, parents, sometimes itching but don't have a rash.   Mental Status Exam Appearance and self-care  Stature:  Stature: Average  Weight:  Weight: Overweight  Clothing:  Clothing: Casual  Grooming:  Grooming: Normal  Cosmetic use:  Cosmetic Use: None  Posture/gait:  Posture/Gait: Normal  Motor activity:  Motor Activity: Not Remarkable  Sensorium  Attention:  Attention: Normal  Concentration:  Concentration: Normal  Orientation:  Orientation: X5  Recall/memory:  Recall/Memory: Normal  Affect and Mood  Affect:  Affect: Appropriate  Mood:  Mood: Anxious, Depressed  Relating  Eye contact:  Eye  Contact: Normal  Facial expression:  Facial Expression: Responsive  Attitude toward examiner:  Attitude Toward Examiner: Cooperative  Thought and Language  Speech flow: Speech Flow: Normal  Thought content:  Thought Content: Appropriate to mood and circumstances  Preoccupation:     Hallucinations:     Organization:     Company secretary of Knowledge:  Fund of Knowledge: Average  Intelligence:  Intelligence: Average  Abstraction:  Abstraction: Normal  Judgement:  Judgement: Fair  Dance movement psychotherapist:  Reality Testing: Realistic  Insight:  Insight: Fair  Decision Making:  Decision Making: Paralyzed  Social Functioning  Social Maturity:  Social Maturity: Isolates  Social Judgement:  Social Judgement: Normal  Stress  Stressors:  Stressors: Money(youngest during the e learning, has to do work with him when come home from work)  Coping Ability:  Coping Ability: Science writer, Horticulturist, commercial Deficits:     Supports:      Family and Psychosocial History: Family history Marital status: Single Are you sexually active?: No What is your sexual orientation?: heterosexual Has your sexual activity been affected by drugs, alcohol, medication, or emotional stress?: no Does patient have children?: Yes How many children?: 2 How is patient's relationship with their children?: Gabrielle Goodman 15 and Gabrielle Goodman-6. Going through teenage phase with oldest, lot of talk back and disrespect. Youngest has ADHD, anxiety, autism. High functioning so  only services at school. It has gotten lot better since first diagnosed at age 38  Childhood History:  Childhood History By whom was/is the patient raised?: Both parents Additional childhood history information: great Description of patient's relationship with caregiver when they were a child: very well Patient's description of current relationship with people who raised him/her: good relationship just hard for them to understand things that I have going on even for my  son mentally. Challenge to get them to understand. How were you disciplined when you got in trouble as a child/adolescent?: no Does patient have siblings?: Yes Number of Siblings: 1 Description of patient's current relationship with siblings: older brother-Eric. We don't have the best relationship, age difference of 10 years older. Did patient suffer any verbal/emotional/physical/sexual abuse as a child?: No Did patient suffer from severe childhood neglect?: No Has patient ever been sexually abused/assaulted/raped as an adolescent or adult?: No Was the patient ever a victim of a crime or a disaster?: No Witnessed domestic violence?: Yes Has patient been effected by domestic violence as an adult?: No Description of domestic violence: brother had some with first wife. I was a teenager. It did not effect me.  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 Patient's job has been impacted by current illness: Yes Describe how patient's job has been impacted: not in all ways has some, noticed it more than others have What is the longest time patient has a held a job?: 4 years Where was the patient employed at that time?: [employer] Did You Receive Any Psychiatric Treatment/Services While in the Military?: No Are There Guns or Other Weapons in Your Home?: No  Education: Engineer, civil (consulting) Currently Attending: no Last Grade Completed: 14 Name of High School: [employer] Did You Graduate From [employer]?: Yes Did You Attend College?: Yes What Type of College Degree Do you Have?: Associate Degree in medical assistance What Was Your Major?: medical assistance Did You Have Any Difficulty  At [facility]?: Yes(math didn't excel not a disability) Were Any Medications Ever Prescribed For These  Difficulties?: No  Religion: Religion/Spirituality Are You A Religious Person?: Yes What is Your Religious Affiliation?: Christian How Might This Affect Treatment?: no  Leisure/Recreation: Leisure / Recreation Leisure and Hobbies: see above  Exercise/Diet: Exercise/Diet Do You Exercise?: No Have You Gained or Lost A Significant Amount of Weight in the Past Six Months?: Yes-Gained Number of Pounds Gained: 40 Do You Follow a Special Diet?: No Do You Have Any Trouble Sleeping?: Yes Explanation of Sleeping Difficulties: on medication now so not having difficulties  CCA Part Two C  Alcohol/Drug Use: Alcohol / Drug Use Pain Medications: no Prescriptions: see med list Over the Counter: Alleve, arthritis in knees History of alcohol / drug use?: No history of alcohol / drug abuse                      CCA Part Three  ASAM's:  Six Dimensions of Multidimensional Assessment  Dimension 1:  Acute Intoxication and/or Withdrawal Potential:     Dimension 2:  Biomedical Conditions and Complications:     Dimension 3:  Emotional, Behavioral, or Cognitive Conditions and Complications:     Dimension 4:  Readiness to Change:     Dimension 5:  Relapse, Continued use, or Continued Problem Potential:     Dimension 6:  Recovery/Living Environment:      Substance use Disorder (SUD)    Social Function:  Social Functioning Social Maturity: Isolates Social Judgement: Normal  Stress:  Stress Stressors: Money(youngest during the e learning, has to do work with him when come home from work) Coping Ability: Software engineer, Exhausted Patient Takes Medications The Way The Doctor Instructed?: Yes Priority Risk: Low Acuity  Risk Assessment- Self-Harm Potential: Risk Assessment For Self-Harm Potential Thoughts of Self-Harm: No current thoughts Method: No plan Availability of Means: No access/NA  Risk Assessment -Dangerous to Others Potential: Risk Assessment For Dangerous to Others  Potential Method: No Plan Availability of Means: No access or NA Intent: Vague intent or NA Notification Required: No need or identified person  DSM5 Diagnoses: Patient Active Problem List   Diagnosis Date Noted  . Left ankle injury 03/01/2015  . Migraine 10/20/2010  . OBESITY 06/03/2010  . Anxiety state, unspecified 06/03/2010  . SLEEP APNEA 06/03/2010  . UNSPECIFIED VITAMIN D DEFICIENCY 05/10/2010    Patient Centered Plan: Patient is on the following Treatment Plan(s):  Anxiety, Depression and Low Self-Esteem, stress management, coping-treatment plan formulated at next treatment session  Recommendations for Services/Supports/Treatments: Recommendations for Services/Supports/Treatments Recommendations For Services/Supports/Treatments: Individual Therapy, Medication Management  Treatment Plan Summary: Patient is a 38 year old single female reporting symptoms of anxiety and depression, significant stressors as a mother of 2 boys.  She is recommended for individual therapy to focus on strategies to manage stressors decrease anxiety and depression, strengthen self-esteem, strength based on supportive interventions.  Patient is currently being followed by primary care doctor and prescribed Celexa as well as being prescribed Elavil at night for sleep.  She has plan to see psychiatrist for psychiatric evaluation to review and monitor medications. This reviewed with patient some strategies to begin to work on mood symptoms.  Identify patient has little time for herself as she works all day and has to help her younger son at night with school.  Reviewed importance of self-care as it identifies she is taking care of herself even if it is only for short periods of time.  Also reviewed patient  needs to engage in activities that will help in enhancing mood not once that will increase depression.  Discussed motivation that builds on itself so she engages in activity for 5 minutes that may help increase  her motivation and also setting goal for herself will help improve self-esteem  Referrals to Alternative Service(s): Referred to Alternative Service(s):   Place:   Date:   Time:    Referred to Alternative Service(s):   Place:   Date:   Time:    Referred to Alternative Service(s):   Place:   Date:   Time:    Referred to Alternative Service(s):   Place:   Date:   Time:     [place]

## 2019-02-05 ENCOUNTER — Ambulatory Visit (INDEPENDENT_AMBULATORY_CARE_PROVIDER_SITE_OTHER): Payer: 59 | Admitting: Licensed Clinical Social Worker

## 2019-02-05 ENCOUNTER — Other Ambulatory Visit: Payer: Self-pay

## 2019-02-05 DIAGNOSIS — F331 Major depressive disorder, recurrent, moderate: Secondary | ICD-10-CM

## 2019-02-05 DIAGNOSIS — F411 Generalized anxiety disorder: Secondary | ICD-10-CM

## 2019-02-05 NOTE — Progress Notes (Signed)
No show

## 2019-02-06 MED FILL — AMPHETAMINE-DEXTROAMPHETAMI: 20 | 30 days supply | Qty: 30 | Fill #0

## 2019-02-07 MED FILL — GABAPENTIN 100 MG CAPSULE: 100 | 68 days supply | Qty: 90 | Fill #1

## 2019-02-09 ENCOUNTER — Emergency Department (HOSPITAL_BASED_OUTPATIENT_CLINIC_OR_DEPARTMENT_OTHER)
Admission: EM | Admit: 2019-02-09 | Discharge: 2019-02-09 | Disposition: A | Discharge status: Home / Self Care | Payer: 59 | Attending: Emergency Medicine | Admitting: Emergency Medicine

## 2019-02-09 ENCOUNTER — Other Ambulatory Visit: Payer: Self-pay

## 2019-02-09 ENCOUNTER — Encounter (HOSPITAL_BASED_OUTPATIENT_CLINIC_OR_DEPARTMENT_OTHER): Payer: Self-pay | Admitting: Emergency Medicine

## 2019-02-09 DIAGNOSIS — Z79899 Other long term (current) drug therapy: Secondary | ICD-10-CM | POA: Insufficient documentation

## 2019-02-09 DIAGNOSIS — G43909 Migraine, unspecified, not intractable, without status migrainosus: Secondary | ICD-10-CM | POA: Diagnosis not present

## 2019-02-09 DIAGNOSIS — G43001 Migraine without aura, not intractable, with status migrainosus: Secondary | ICD-10-CM | POA: Diagnosis not present

## 2019-02-09 DIAGNOSIS — R519 Headache, unspecified: Secondary | ICD-10-CM | POA: Diagnosis present

## 2019-02-09 MED ORDER — METOCLOPRAMIDE HCL 10 MG PO TABS: 10.0000 mg | ORAL_TABLET | Freq: Four times a day (QID) | ORAL | 0 refills | Status: DC | PRN

## 2019-02-09 MED ORDER — KETOROLAC TROMETHAMINE 30 MG/ML IJ SOLN
30.0000 mg | Freq: Once | INTRAMUSCULAR | Status: AC
  Administered 2019-02-09: 30 mg via INTRAVENOUS
  Filled 2019-02-09: qty 1

## 2019-02-09 MED ORDER — DIPHENHYDRAMINE HCL 50 MG/ML IJ SOLN
25.0000 mg | Freq: Once | INTRAMUSCULAR | Status: AC
  Administered 2019-02-09: 25 mg via INTRAVENOUS
  Filled 2019-02-09: qty 1

## 2019-02-09 MED ORDER — IBUPROFEN 800 MG PO TABS: 800.0000 mg | ORAL_TABLET | Freq: Three times a day (TID) | ORAL | 0 refills | Status: DC

## 2019-02-09 MED ORDER — DIPHENHYDRAMINE HCL 25 MG PO TABS: 25.0000 mg | ORAL_TABLET | Freq: Four times a day (QID) | ORAL | 0 refills | Status: DC

## 2019-02-09 MED ORDER — SODIUM CHLORIDE 0.9 % IV BOLUS
1000.0000 mL | Freq: Once | INTRAVENOUS | Status: AC
Start: 2019-02-09 — End: 2019-02-09
  Administered 2019-02-09: 1000 mL via INTRAVENOUS

## 2019-02-09 MED ORDER — DEXAMETHASONE SODIUM PHOSPHATE 10 MG/ML IJ SOLN
10.0000 mg | Freq: Once | INTRAMUSCULAR | Status: AC
  Administered 2019-02-09: 09:00:00 10 mg via INTRAVENOUS
  Filled 2019-02-09: qty 1

## 2019-02-09 MED ORDER — ACETAMINOPHEN 500 MG PO TABS
1000.0000 mg | ORAL_TABLET | Freq: Once | ORAL | Status: AC
  Administered 2019-02-09: 1000 mg via ORAL
  Filled 2019-02-09: qty 2

## 2019-02-09 MED ORDER — METOCLOPRAMIDE HCL 5 MG/ML IJ SOLN
10.0000 mg | Freq: Once | INTRAMUSCULAR | Status: AC
  Administered 2019-02-09: 10 mg via INTRAVENOUS
  Filled 2019-02-09: qty 2

## 2019-02-09 MED ORDER — DIPHENHYDRAMINE HCL 50 MG/ML IJ SOLN
25.0000 mg | Freq: Once | INTRAMUSCULAR | Status: AC
Start: 2019-02-09 — End: 2019-02-09
  Administered 2019-02-09: 25 mg via INTRAVENOUS
  Filled 2019-02-09: qty 1

## 2019-02-09 NOTE — ED Triage Notes (Signed)
Reports headaches x7 days, similar to her typical migraines but worst in quality. States her Imitrex didn't work. Also tried motrin, goody powders with no relief. Pt reports pain worse on L side, nausea, mild light sensitivity.

## 2019-02-09 NOTE — ED Provider Notes (Signed)
[facility] EMERGENCY DEPARTMENT Provider Note   CSN: [id number] Arrival date & time: 02/09/19  0630     History   Chief Complaint Chief Complaint  Patient presents with  . Headache    HPI Gabrielle Goodman is a 38 y.o. female.     HPI Patient has long history of migraine headaches.  Headache started 7 days ago like typical migraine.  Aching on the left side of the head.  Nausea and mild light sensitivity.  Patient vomited once 2 days ago.  She has been trying typical migraine medications including Imitrex without relief.  No fever, no chills, no visual changes, no sore throat or nasal congestion.  No focal weakness numbness tingling or gait instability.  Patient reports headache was typical for her usual headache but has not resolved.  She reports she contacted her PCP who was giving her instructions on medication but has not gotten relief. Past Medical History:  Diagnosis Date  . Depression    effexor  . GAD (generalized anxiety disorder)   . Migraines   . Obesity     Patient Active Problem List   Diagnosis Date Noted  . Left ankle injury 03/01/2015  . Migraine 10/20/2010  . OBESITY 06/03/2010  . Anxiety state, unspecified 06/03/2010  . SLEEP APNEA 06/03/2010  . UNSPECIFIED VITAMIN D DEFICIENCY 05/10/2010    Past Surgical History:  Procedure Laterality Date  . BREAST SURGERY  2011   Breast reduction  . CESAREAN SECTION    . LTCS  2005     OB History    Gravida  2   Para  1   Term      Preterm      AB      Living        SAB      TAB      Ectopic      Multiple      Live Births               Home Medications    Prior to Admission medications   Medication Sig Start Date End Date Taking? Authorizing Provider  SUMAtriptan (IMITREX) 50 MG tablet Take one at onset of headache, may repeat after two hours if needed 12/26/17  Yes [provider]  amitriptyline (ELAVIL) 25 MG tablet Take 25 mg by mouth at bedtime.    [provider]  amphetamine-dextroamphetamine (ADDERALL XR) 10 MG 24 hr capsule Take 10 mg by mouth daily.    [provider]  citalopram (CELEXA) 40 MG tablet TAKE 1/2 TABLET BY MOUTH DAILY FOR 1 WEEK, THEN 1 TABLET DAILY 05/12/12   Agapito GamesMetheney, Catherine D, MD  diphenhydrAMINE (BENADRYL) 25 MG tablet Take 1 tablet (25 mg total) by mouth every 6 (six) hours. 02/09/19   Arby BarrettePfeiffer, Kimothy Kishimoto, MD  gabapentin (NEURONTIN) 100 MG capsule  11/26/18   [provider]  ibuprofen (ADVIL) 800 MG tablet Take 1 tablet (800 mg total) by mouth 3 (three) times daily. 02/09/19   Arby BarrettePfeiffer, Ceairra Mccarver, MD  ibuprofen (ADVIL,MOTRIN) 800 MG tablet Take 1 tablet (800 mg total) by mouth 3 (three) times daily. Patient not taking: Reported on 01/13/2019 02/13/15   Rancour, Jeannett SeniorStephen, MD  metoCLOPramide (REGLAN) 10 MG tablet Take 1 tablet (10 mg total) by mouth every 6 (six) hours as needed for nausea (nausea/headache). 02/09/19   Arby BarrettePfeiffer, Adarsh Mundorf, MD  naproxen (NAPROSYN) 500 MG tablet Take 1 tablet (500 mg total) by mouth 2 (two) times daily. Patient not taking: Reported  on 01/13/2019 07/01/15   Arby Barrette, MD  norethindrone-ethinyl estradiol (MICROGESTIN,JUNEL,LOESTRIN) 1-20 MG-MCG tablet Take 1 tablet by mouth daily.    [provider]  norethindrone-ethinyl estradiol-iron (MICROGESTIN FE,GILDESS FE,LOESTRIN FE) 1.5-30 MG-MCG tablet Take 1 tablet by mouth daily.    [provider]    Family History Family History  Problem Relation Age of Onset  . Hypertension Mother   . Diabetes Mother   . Hypertension Maternal Grandmother   . Diabetes Maternal Grandmother   . Rheum arthritis Paternal Grandmother   . Hypertension Paternal Grandmother   . Breast cancer Paternal Aunt     Social History Social History   Tobacco Use  . Smoking status: Never Smoker  . Smokeless tobacco: Never Used  Substance Use Topics  . Alcohol use: No    Alcohol/week: 0.0 standard drinks  . Drug use: No     Allergies    Oxycodone-acetaminophen and Penicillins   Review of Systems Review of Systems 10 Systems reviewed and are negative for acute change except as noted in the HPI.   Physical Exam Updated Vital Signs BP 130/79 (BP Location: Right Arm)   Pulse 78   Temp 98.7 F (37.1 C) (Oral)   Resp 18   Ht 5\' 4"  (1.626 m)   Wt 136.1 kg   LMP  (LMP Unknown)   SpO2 100%   BMI 51.49 kg/m   Physical Exam Constitutional:      Appearance: She is well-developed.  HENT:     Head: Normocephalic and atraumatic.  Eyes:     Extraocular Movements: Extraocular movements intact.     Pupils: Pupils are equal, round, and reactive to light.  Neck:     Musculoskeletal: Neck supple.  Cardiovascular:     Rate and Rhythm: Normal rate and regular rhythm.     Heart sounds: Normal heart sounds.  Pulmonary:     Effort: Pulmonary effort is normal.     Breath sounds: Normal breath sounds.  Abdominal:     General: Bowel sounds are normal. There is no distension.     Palpations: Abdomen is soft.     Tenderness: There is no abdominal tenderness.  Musculoskeletal: Normal range of motion.  Skin:    General: Skin is warm and dry.  Neurological:     General: No focal deficit present.     Mental Status: She is alert and oriented to person, place, and time.     GCS: GCS eye subscore is 4. GCS verbal subscore is 5. GCS motor subscore is 6.     Cranial Nerves: No cranial nerve deficit.     Sensory: No sensory deficit.     Coordination: Coordination normal.  Psychiatric:        Mood and Affect: Mood normal.      ED Treatments / Results  Labs (all labs ordered are listed, but only abnormal results are displayed) Labs Reviewed - No data to display  EKG None  Radiology No results found.  Procedures Procedures (including critical care time)  Medications Ordered in ED Medications  metoCLOPramide (REGLAN) injection 10 mg (10 mg Intravenous Given 02/09/19 0742)  diphenhydrAMINE (BENADRYL) injection 25 mg (25  mg Intravenous Given 02/09/19 0741)  ketorolac (TORADOL) 30 MG/ML injection 30 mg (30 mg Intravenous Given 02/09/19 0741)  sodium chloride 0.9 % bolus 1,000 mL (0 mLs Intravenous Stopped 02/09/19 0848)  dexamethasone (DECADRON) injection 10 mg (10 mg Intravenous Given 02/09/19 0851)  diphenhydrAMINE (BENADRYL) injection 25 mg (25 mg Intravenous Given 02/09/19  0852)  acetaminophen (TYLENOL) tablet 1,000 mg (1,000 mg Oral Given 02/09/19 0850)     Initial Impression / Assessment and Plan / ED Course  I have reviewed the triage vital signs and the nursing notes.  Pertinent labs & imaging results that were available during my care of the patient were reviewed by me and considered in my medical decision making (see chart for details).  Clinical Course as of Feb 09 1008  Sun Feb 09, 2019  0828 Patient reports headache is improving.  Not resolved yet.    [MP]  1004 Patient reports headache is now much better.  She is alert in no distress and talking on the phone comfortably.   [MP]    Clinical Course User Index [MP] Charlesetta Shanks, MD      Patient presents with history of migraine headaches.  Headache typical at onset but persistent.  No neurologic dysfunction or cognitive dysfunction.  Exam is normal.  Mentation is normal.  Will give migraine cocktail.  Patient presents with migraine history.  Headache have been typical but would not resolve over several days duration with usual home treatment.  Headache resolved with combination of Reglan, Benadryl, Toradol, Decadron, Tylenol and IV fluids.  Patient has no neurologic symptoms or infectious type symptoms.  Return precautions reviewed.  Final Clinical Impressions(s) / ED Diagnoses   Final diagnoses:  Migraine without aura and with status migrainosus, not intractable    ED Discharge Orders         Ordered    metoCLOPramide (REGLAN) 10 MG tablet  Every 6 hours PRN     02/09/19 1006    ibuprofen (ADVIL) 800 MG tablet  3 times daily      02/09/19 1006    diphenhydrAMINE (BENADRYL) 25 MG tablet  Every 6 hours     02/09/19 1006           Charlesetta Shanks, MD 02/09/19 1010

## 2019-02-09 NOTE — Discharge Instructions (Addendum)
1.  If your migraine headache is not controlled by your usual migraine medication, you may try a combination of Reglan with Benadryl 25 to 50 mg and ibuprofen 800 mg together at one time.  You have been given prescriptions for these medications. 2.  Follow-up with your doctor to discuss your migraine management. 3.  Return to the emergency department if you have symptoms new different or concerning or your headache comes back and cannot be controlled by home medications.

## 2019-02-14 DIAGNOSIS — R7303 Prediabetes: Secondary | ICD-10-CM | POA: Diagnosis not present

## 2019-02-14 DIAGNOSIS — E538 Deficiency of other specified B group vitamins: Secondary | ICD-10-CM | POA: Diagnosis not present

## 2019-02-14 DIAGNOSIS — G43909 Migraine, unspecified, not intractable, without status migrainosus: Secondary | ICD-10-CM | POA: Diagnosis not present

## 2019-02-18 MED FILL — SUMATRIPTAN SUCC 50 MG TAB: 50 | 30 days supply | Qty: 9 | Fill #0

## 2019-02-18 MED FILL — GABAPENTIN 300 MG CAPSULE: 300 | 30 days supply | Qty: 90 | Fill #0

## 2019-02-20 MED FILL — CYANOCOBALAMIN 1,000 MCG/ML: 1000 | 90 days supply | Qty: 3 | Fill #0

## 2019-02-20 MED FILL — IBUPROFEN 800 MG TAB: 800 | 10 days supply | Qty: 30 | Fill #0

## 2019-02-24 MED FILL — LARIN FE 1.5-30 TABLET: 1.5-30 | 84 days supply | Qty: 112 | Fill #1

## 2019-03-10 ENCOUNTER — Ambulatory Visit: Payer: 59 | Attending: Nurse Practitioner | Admitting: Physical Therapy

## 2019-03-10 ENCOUNTER — Other Ambulatory Visit: Payer: Self-pay

## 2019-03-10 ENCOUNTER — Encounter: Payer: Self-pay | Admitting: Physical Therapy

## 2019-03-10 DIAGNOSIS — R293 Abnormal posture: Secondary | ICD-10-CM | POA: Insufficient documentation

## 2019-03-10 DIAGNOSIS — R262 Difficulty in walking, not elsewhere classified: Secondary | ICD-10-CM | POA: Insufficient documentation

## 2019-03-10 DIAGNOSIS — M5441 Lumbago with sciatica, right side: Secondary | ICD-10-CM | POA: Insufficient documentation

## 2019-03-10 DIAGNOSIS — G8929 Other chronic pain: Secondary | ICD-10-CM | POA: Insufficient documentation

## 2019-03-10 MED FILL — DEXTROAMP-AMPHETAMIN 20 MG: 20 | 30 days supply | Qty: 30 | Fill #0

## 2019-03-10 NOTE — Therapy (Addendum)
Gabrielle Goodman, [place], [address] Phone: [phone]   Fax:  [phone]  Physical Therapy Evaluation/Discharge  Patient Details  Name: Gabrielle Goodman MRN: [id number] Date of Birth: December 18, 1980 Referring Provider (PT): Eldridge Abrahams, NP   Encounter Date: 03/10/2019  PT End of Session - 03/10/19 0814    Visit Number  1    Number of Visits  16    Date for PT Re-Evaluation  05/05/19    PT Start Time  0745    PT Stop Time  0830    PT Time Calculation (min)  45 min       Past Medical History:  Diagnosis Date  . Depression    effexor  . GAD (generalized anxiety disorder)   . Migraines   . Obesity     Past Surgical History:  Procedure Laterality Date  . BREAST SURGERY  2011   Breast reduction  . CESAREAN SECTION    . LTCS  2005    There were no vitals filed for this visit.   Subjective Assessment - 03/10/19 0751    Subjective  PT has had chronic LBP since the birth of her 38 yr old.  She reports she had a L5-S1 disc post pregnancy.  She has had ESI which calms her pain down.  She has trouble with basic housework, standing and walking, work activities.  Symptoms are mostly the Rt LE but if she lies on l side her L thigh goes numb.  Rt LE weaker than L.    Limitations  Lifting;Standing;Walking;House hold activities   sleeping at times   How long can you stand comfortably?  20-30 min    How long can you walk comfortably?  worse, within a couple of minutes    Diagnostic tests  not available, but pt reports spinal stenosis, done at [facility].    Patient Stated Goals  Pt would like to be able to walk or exercise so I can lose some weight. Do things without pain.    Currently in Pain?  Yes    Pain Score  3     Pain Location  Back    Pain Orientation  Right;Left;Lower    Pain Descriptors / Indicators  Tightness;Squeezing;Radiating;Burning;Aching    Pain Type  Chronic pain    Pain Radiating Towards  Rt LE    Pain  Onset  More than a month ago    Pain Frequency  Intermittent    Aggravating Factors   standing, walking    Pain Relieving Factors  sitting, change positions, heat, meds provide min relief , Gaba helps leg pain.    Effect of Pain on Daily Activities  intereferes with home life, had to quit 2nd job    Multiple Pain Sites  Yes    Pain Score  10    Pain Location  Knee    Pain Orientation  Left    Pain Descriptors / Indicators  Throbbing    Pain Type  Chronic pain    Pain Onset  More than a month ago    Pain Frequency  Constant    Aggravating Factors   walking    Pain Relieving Factors  nothing right now    Effect of Pain on Daily Activities  some days better than others         [facility] PT Assessment - 03/10/19 0001      Assessment   Medical Diagnosis  spinal stenosis     Referring Provider (  PT)  Eldridge Abrahams, NP    Onset Date/Surgical Date  --   6 yrs ago    Prior Therapy  No       Precautions   Precautions  None      Restrictions   Weight Bearing Restrictions  No      Balance Screen   Has the patient fallen in the past 6 months  No      [place] residence    Living Arrangements  Children    Type of [place] Access  Level entry      Prior Function   Level of Independence  Independent    Vocation  Full time employment    Vocation Requirements  Med. Asst.     Leisure  2 children, teen and 43 yr old, family time       Cognition   Overall Cognitive Status  Within Functional Limits for tasks assessed      Observation/Other Assessments   Focus on Therapeutic Outcomes (FOTO)   60%      Sensation   Light Touch  Appears Intact    Additional Comments  Rt LE numb when pain       Coordination   Gross Motor Movements are Fluid and Coordinated  No      Posture/Postural Control   Posture/Postural Control  Postural limitations    Postural Limitations  Rounded Shoulders;Forward head;Increased thoracic kyphosis    Posture  Comments  genu valgus, obesity       AROM   Lumbar Flexion  50%     Lumbar Extension  75%    Lumbar - Right Side Bend  pain on R WNL     Lumbar - Left Side Bend  no pain WNL     Lumbar - Right Rotation  WNL    Lumbar - Left Rotation  WNL      PROM   Overall PROM Comments  hip       Strength   Overall Strength Comments  poor core strength    Right Hip Flexion  4+/5    Right Hip ABduction  4+/5    Left Hip Flexion  5/5    Right Knee Flexion  5/5    Right Knee Extension  5/5    Left Knee Flexion  5/5    Left Knee Extension  5/5      Palpation   Spinal mobility  NT due to discomfort in prone    Palpation comment  Sore along R paraspinals and Rt lateral trunk , Rt glutes      Ambulation/Gait   Ambulation Distance (Feet)  150 Feet    Assistive device  None    Gait Pattern  Step-through pattern;Decreased stance time - left    Gait Comments  slower than normal pace , otherwise no major deficits          Objective measurements completed on examination: See above findings.     PT Education - 03/10/19 0814    Education Details  PT/POC, HEP, spinal stenosis    Person(s) Educated  Patient    Methods  Explanation;Handout    Comprehension  Verbalized understanding;Returned demonstration       PT Short Term Goals - 03/10/19 0827      PT SHORT TERM GOAL #1   Title  Pt will be I with HEP    Time  4    Period  Weeks  Status  New    Target Date  04/07/19      PT SHORT TERM GOAL #2   Title  Pt will be able to report pain end of work day improved to no more than 6/10, about 3 days of the week    Time  4    Period  Weeks    Status  New    Target Date  04/07/19      PT SHORT TERM GOAL #3   Title  Pt will be able to report more awareness of core and posture with daily activities.    Time  4    Period  Weeks    Status  New    Target Date  04/07/19        PT Long Term Goals - 03/10/19 0835      PT LONG TERM GOAL #1   Title  Pt will be able to improve FOTO score to  less than 46% to demo improvement in function overall.    Time  8    Period  Weeks    Status  New    Target Date  05/05/19      PT LONG TERM GOAL #2   Title  Pt will be able to do normal ADLs, light housework with min increase in pain    Time  8    Period  Weeks    Status  New    Target Date  05/05/19      PT LONG TERM GOAL #3   Title  Pt will be able to lift, carry mod sized items in her home and work and report no more than min increase in pain.    Time  8    Period  Weeks    Status  New    Target Date  05/05/19      PT LONG TERM GOAL #4   Title  Pt will be I with HEP for core, trunk flexibility    Time  8    Period  Weeks    Status  New    Target Date  05/05/19      PT LONG TERM GOAL #5   Title  Pt will be able to walk for 30 min and report min LE symptoms    Time  8    Period  Weeks    Status  New    Target Date  05/05/19             Plan - 03/10/19 0845    Clinical Impression Statement  Pt presents for mod complexity eval of low back pain and Rt sided leg pain associated with spinal stenosis.  Her condition is chronic (6+ yrs) and complicated my L sided knee pain.  She showed normal LE strength in LEs but with painful extension and sidebending.  Limited ability to stand and walk, perform job duties and participate in recreation due to pain limitations.  She will benefit from PT to improve her ability to function in her home and work environment.    Personal Factors and Comorbidities  Time since onset of injury/illness/exacerbation;Comorbidity 1;Past/Current Experience;Social Background    Comorbidities  obesity    Examination-Activity Limitations  Squat;Stairs;Bed Mobility;Stand;Locomotion Level;Reach Overhead;Transfers;Sleep;Carry;Bend;Lift    Examination-Participation Restrictions  Shop;Community Activity;Interpersonal Relationship;Other;Meal Prep;Cleaning    Stability/Clinical Decision Making  Evolving/Moderate complexity    Clinical Decision Making  Moderate     Rehab Potential  Good    PT Frequency  2x / week  PT Duration  8 weeks    PT Treatment/Interventions  ADLs/Self Care Home Management;Electrical Stimulation;Therapeutic activities;Iontophoresis 43m/ml Dexamethasone;Moist Heat;Cryotherapy;Ultrasound;Functional mobility training;Neuromuscular re-education;Passive range of motion;Manual techniques;Taping;Therapeutic exercise    PT Next Visit Plan  check HEP, Nustep, core, flexion based ex    PT Home Exercise Plan  PPT, LTR, knee to chest and hamstring stretch    Recommended Other Services  none    Consulted and Agree with Plan of Care  Patient       Patient will benefit from skilled therapeutic intervention in order to improve the following deficits and impairments:  Decreased activity tolerance, Decreased endurance, Decreased range of motion, Decreased strength, Difficulty walking, Postural dysfunction, Decreased mobility, Impaired sensation, Pain  Visit Diagnosis: Chronic right-sided low back pain with right-sided sciatica  Abnormal posture  Difficulty in walking, not elsewhere classified     Problem List Patient Active Problem List   Diagnosis Date Noted  . Left ankle injury 03/01/2015  . Migraine 10/20/2010  . OBESITY 06/03/2010  . Anxiety state, unspecified 06/03/2010  . SLEEP APNEA 06/03/2010  . UNSPECIFIED VITAMIN D DEFICIENCY 05/10/2010    Gabrielle Goodman 03/10/2019, 9:03 AM  [facility] [place] [address]Phone: [phone]  Fax:  [phone] Name: ERenata GambinoMRN: [id number] of Birth: [birth year]   JRaeford Razor PT 03/10/19 9:03 AM Phone: [phone]: [phone] PHYSICAL THERAPY DISCHARGE SUMMARY  Visits from Start of Care: 1  Current functional level related to goals / functional outcomes: See above    Remaining deficits: Unknown   Education / Equipment: See above  Plan: Patient agrees to discharge.   Patient goals were not met. Patient is being discharged due to not returning since the last visit.  ?????    Decided against PT  JRaeford Razor PT 05/12/19 11:29 AM Phone: [phone]: [phone]

## 2019-03-10 NOTE — Patient Instructions (Signed)
Access Code: [email or web address]  URL: [email or web address]  Date: 03/10/2019  Prepared by: Raeford Razor   Exercises  Supine Lower Trunk Rotation - 10 reps - 2 sets - 10 hold - 2x daily - 7x weekly  Supine Posterior Pelvic Tilt - 10 reps - 2 sets - 5 hold - 2x daily - 7x weekly  Single Knee to Chest Stretch - 3 reps - 1 sets - 30 hold - 2x daily - 7x weekly  Seated Hamstring Stretch - 3 reps - 1 sets - 30 hold - 1x daily - 7x weekly  Supine Hamstring Stretch with Strap - 3 reps - 1 sets - 30 hold - 1x daily - 7x weekly

## 2019-03-17 ENCOUNTER — Encounter: Payer: 59 | Admitting: Physical Therapy

## 2019-03-21 ENCOUNTER — Encounter: Payer: Self-pay | Admitting: Orthopaedic Surgery

## 2019-03-21 ENCOUNTER — Ambulatory Visit: Payer: 59 | Admitting: Physical Therapy

## 2019-03-21 ENCOUNTER — Ambulatory Visit: Payer: Self-pay

## 2019-03-21 ENCOUNTER — Ambulatory Visit: Payer: 59 | Admitting: Orthopaedic Surgery

## 2019-03-21 ENCOUNTER — Other Ambulatory Visit: Payer: Self-pay

## 2019-03-21 VITALS — Ht 64.0 in | Wt 305.0 lb

## 2019-03-21 DIAGNOSIS — G8929 Other chronic pain: Secondary | ICD-10-CM

## 2019-03-21 DIAGNOSIS — M25562 Pain in left knee: Secondary | ICD-10-CM | POA: Diagnosis not present

## 2019-03-21 DIAGNOSIS — M25561 Pain in right knee: Secondary | ICD-10-CM

## 2019-03-21 MED ORDER — LIDOCAINE HCL 1 % IJ SOLN
3.0000 mL | INTRAMUSCULAR | Status: AC | PRN
  Administered 2019-03-21: 3 mL

## 2019-03-21 MED ORDER — METHYLPREDNISOLONE ACETATE 40 MG/ML IJ SUSP
13.3300 mg | INTRAMUSCULAR | Status: AC | PRN
  Administered 2019-03-21: 09:00:00 13.33 mg via INTRA_ARTICULAR

## 2019-03-21 MED ORDER — BUPIVACAINE HCL 0.25 % IJ SOLN
0.6600 mL | INTRAMUSCULAR | Status: AC | PRN
  Administered 2019-03-21: 09:00:00 .66 mL via INTRA_ARTICULAR

## 2019-03-21 MED ORDER — LIDOCAINE HCL 1 % IJ SOLN
3.0000 mL | INTRAMUSCULAR | Status: AC | PRN
  Administered 2019-03-21: 09:00:00 3 mL

## 2019-03-21 MED FILL — CITALOPRAM HBR 40 MG TABLET: 40 | 90 days supply | Qty: 90 | Fill #0

## 2019-03-21 NOTE — Progress Notes (Signed)
Office Visit Note   Patient: Gabrielle Goodman           Date of Birth: 21-Oct-1980           MRN: [id number] Visit Date: 03/21/2019              Requested by: Berkley Harvey, NP [place],   [address] PCP: Berkley Harvey, NP   Assessment & Plan: Visit Diagnoses:  1. Chronic pain of both knees     Plan: Impression is bilateral knee patellofemoral syndrome.  We will inject both knees with cortisone today.  We will also start her in physical therapy.  She has agreed to go to 1 session to learn exercises she should and should not do for her condition.  She will really work at [employer] and weight loss.  Follow-up as needed.  Follow-Up Instructions: Return if symptoms worsen or fail to improve.   Orders:  Orders Placed This Encounter  Procedures  . Large Joint Inj: bilateral knee  . XR KNEE 3 VIEW LEFT  . XR KNEE 3 VIEW RIGHT  . Ambulatory referral to Physical Therapy   No orders of the defined types were placed in this encounter.     Procedures: Large Joint Inj: bilateral knee on 03/21/2019 9:01 AM Indications: pain Details: 22 G needle, anterolateral approach Medications (Right): 0.66 mL bupivacaine 0.25 %; 3 mL lidocaine 1 %; 13.33 mg methylPREDNISolone acetate 40 MG/ML Medications (Left): 0.66 mL bupivacaine 0.25 %; 3 mL lidocaine 1 %; 13.33 mg methylPREDNISolone acetate 40 MG/ML      Clinical Data: No additional findings.   Subjective: Chief Complaint  Patient presents with  . Left Knee - Pain  . Right Knee - Pain    HPI patient is a pleasant 38 year old female who presents our clinic today with bilateral knee pain left greater than right.  This has been ongoing for the past few years and has progressively worsened.  The pain is primarily to the knee anterolateral aspect.  She describes this as a constant ache with associated stiffness and popping.  Pain is aggravated when walking or going up and down stairs.  She has  tried Biofreeze without significant relief of symptoms.  She does note previous cortisone injection to the right knee about a year ago with good relief until recently.  She also notes cortisone injection to the left knee in August by her PCP but states that the doctor had trouble getting the injection into her knee.  She did not have any relief following the injection.  No previous surgery on either knee.  Review of Systems as detailed in HPI.  All others reviewed and are negative.   Objective: Vital Signs: Ht 5\' 4"  (1.626 m)   Wt (!) 305 lb (138.3 kg)   BMI 52.35 kg/m   Physical Exam well-developed well-nourished female in no acute distress.  Alert and oriented x3.  Ortho Exam examination of both knees reveals valgus deformity.  Range of motion 0 to 110 degrees.  Markedly increased Q angle both sides.  Minimal patellofemoral crepitus.  Lateral joint line tenderness on the left.  Ligaments are stable.  She is neurovascular intact distally.  Specialty Comments:  No specialty comments available.  Imaging: XR KNEE 3 VIEW LEFT  Result Date: 03/21/2019 No acute or structural abnormalities  XR KNEE 3 VIEW RIGHT  Result Date: 03/21/2019 No acute or structural abnormalities    PMFS History: Patient Active Problem List  Diagnosis Date Noted  . Left ankle injury 03/01/2015  . Migraine 10/20/2010  . OBESITY 06/03/2010  . Anxiety state, unspecified 06/03/2010  . SLEEP APNEA 06/03/2010  . UNSPECIFIED VITAMIN D DEFICIENCY 05/10/2010   Past Medical History:  Diagnosis Date  . Depression    effexor  . GAD (generalized anxiety disorder)   . Migraines   . Obesity     Family History  Problem Relation Age of Onset  . Hypertension Mother   . Diabetes Mother   . Hypertension Maternal Grandmother   . Diabetes Maternal Grandmother   . Rheum arthritis Paternal Grandmother   . Hypertension Paternal Grandmother   . Breast cancer Paternal Aunt     Past Surgical History:  Procedure  Laterality Date  . BREAST SURGERY  2011   Breast reduction  . CESAREAN SECTION    . LTCS  2005   Social History   Occupational History  . Occupation: medical asst  Tobacco Use  . Smoking status: Never Smoker  . Smokeless tobacco: Never Used  Substance and Sexual Activity  . Alcohol use: No    Alcohol/week: 0.0 standard drinks  . Drug use: No  . Sexual activity: Yes    Birth control/protection: None

## 2019-03-24 ENCOUNTER — Encounter: Payer: 59 | Admitting: Physical Therapy

## 2019-03-25 ENCOUNTER — Other Ambulatory Visit: Payer: Self-pay

## 2019-03-25 ENCOUNTER — Encounter: Payer: Self-pay | Admitting: Neurology

## 2019-03-25 ENCOUNTER — Encounter: Payer: Self-pay | Admitting: *Deleted

## 2019-03-25 ENCOUNTER — Ambulatory Visit (INDEPENDENT_AMBULATORY_CARE_PROVIDER_SITE_OTHER): Payer: 59 | Admitting: Neurology

## 2019-03-25 ENCOUNTER — Telehealth: Payer: Self-pay | Admitting: *Deleted

## 2019-03-25 ENCOUNTER — Encounter: Payer: 59 | Admitting: Physical Therapy

## 2019-03-25 ENCOUNTER — Telehealth: Payer: Self-pay | Admitting: Neurology

## 2019-03-25 VITALS — BP 127/88 | HR 83 | Temp 97.5°F | Ht 64.0 in | Wt 298.0 lb

## 2019-03-25 DIAGNOSIS — G43709 Chronic migraine without aura, not intractable, without status migrainosus: Secondary | ICD-10-CM | POA: Diagnosis not present

## 2019-03-25 DIAGNOSIS — M5417 Radiculopathy, lumbosacral region: Secondary | ICD-10-CM

## 2019-03-25 MED ORDER — UBRELVY 100 MG PO TABS: 100.0000 mg | ORAL_TABLET | ORAL | 0 refills | Status: DC | PRN

## 2019-03-25 MED ORDER — AJOVY 225 MG/1.5ML ~~LOC~~ SOAJ: 225.0000 mg | SUBCUTANEOUS | 11 refills | Status: DC

## 2019-03-25 NOTE — Patient Instructions (Addendum)
Start Botox Ubrelvy: Please take one tablet at the onset of your headache. If it does not improve the symptoms please take one additional tablet. Do not take more then 2 tablets in 24hrs. Do not take use more then 2 to 3 times in a week. Start Ajovy Dr. Clydell Hakim at [facility] injection What is this medicine? FREMANEZUMAB (fre ma NEZ ue mab) is used to prevent migraine headaches. This medicine may be used for other purposes; ask your health care provider or pharmacist if you have questions. COMMON BRAND NAME(S): AJOVY What should I tell my health care provider before I take this medicine? They need to know if you have any of these conditions:  an unusual or allergic reaction to fremanezumab, other medicines, foods, dyes, or preservatives  pregnant or trying to get pregnant  breast-feeding How should I use this medicine? This medicine is for injection under the skin. You will be taught how to prepare and give this medicine. Use exactly as directed. Take your medicine at regular intervals. Do not take your medicine more often than directed. It is important that you put your used needles and syringes in a special sharps container. Do not put them in a trash can. If you do not have a sharps container, call your pharmacist or healthcare provider to get one. Talk to your pediatrician regarding the use of this medicine in children. Special care may be needed. Overdosage: If you think you have taken too much of this medicine contact a poison control center or emergency room at once. NOTE: This medicine is only for you. Do not share this medicine with others. What if I miss a dose? If you miss a dose, take it as soon as you can. If it is almost time for your next dose, take only that dose. Do not take double or extra doses. What may interact with this medicine? Interactions are not expected. This list may not describe all possible interactions. Give your health care  provider a list of all the medicines, herbs, non-prescription drugs, or dietary supplements you use. Also tell them if you smoke, drink alcohol, or use illegal drugs. Some items may interact with your medicine. What should I watch for while using this medicine? Tell your doctor or healthcare professional if your symptoms do not start to get better or if they get worse. What side effects may I notice from receiving this medicine? Side effects that you should report to your doctor or health care professional as soon as possible:  allergic reactions like skin rash, itching or hives, swelling of the face, lips, or tongue Side effects that usually do not require medical attention (report these to your doctor or health care professional if they continue or are bothersome):  pain, redness, or irritation at site where injected This list may not describe all possible side effects. Call your doctor for medical advice about side effects. You may report side effects to FDA at 1-800-FDA-1088. Where should I keep my medicine? Keep out of the reach of children. You will be instructed on how to store this medicine. Throw away any unused medicine after the expiration date on the label. NOTE: This sheet is a summary. It may not cover all possible information. If you have questions about this medicine, talk to your doctor, pharmacist, or health care provider.  2020 Elsevier/Gold Standard (2016-12-25 17:22:56)

## 2019-03-25 NOTE — Telephone Encounter (Signed)
While PA is pending, I have faxed over to [place] a stagnant copay card for Ajovy that can be used right now. Received a receipt of confirmation.

## 2019-03-25 NOTE — Progress Notes (Signed)
[facility]    Provider:  Dr Lucia GaskinsAhern Requesting Provider: Iona HansenJones, Penny L, NP Primary Care Provider:  Iona HansenJones, Penny L, NP  CC:  Migraine  HPI:  Gabrielle Goodman is a 38 y.o. female here as requested by Gabrielle HansenJones, Penny L, NP for migraines. She was also referred for sleep apnea and is in the process of getting that scheduled. PMHX severe obesity, ADHD, adjustment disorder with mixed anxiety, migraine was well controlled on propranolol but too sedating, sleep apnea controlled on CPAP, migraines, lumbar spinal stenoses and radiculopathy, OSA, neuropathy right leg.  I reviewed Gabrielle Goodman's note.  Patient's been trying to lose weight.  She is trying to walk.  Patient has a history of migraines and has actually been to the emergency room, she was started on steroids and Benadryl, no headaches since then, she takes Adderall daily for work, saw neuro and has a CPAP now for obstructive sleep apnea, she is tried amitriptyline, gabapentin and increasing this for problems with her low back.  She has had migraines for most of her life, started as a teenager, she tried many things throughout the years, she tried Topamax and she could not function on it. They have worsened. She has 12 migraine days a month. She has daily headaches. Mostly on the left, behind the eye and radiates down the back and to the neck. Imitrex makes her tired. She is on gabapentin, amitriptyline. Also celexa. Pulsating, pounding, throbbing, movement makes it worse, light.sound, nausea and vomiting, can last 24-72 on average and one lasted all week and went to the ED. Also tried propranolol. She uses her cpap regularly, auto pap. She also has sciatic pain, she has had ESIs which helped but the pain came back. One ESI helps for a year. Pain radiates and burnins into the lower spn, stays around the lower back and can radiate down the back.   Reviewed notes, labs and imaging from outside physicians, which showed:  MRI of the lumbar  spine, shallow right subarticular disc protrusion at L5-S1 with resultant mild to moderate right greater than left lateral recess stenosis, potentially affecting either of the descending S1 nerve roots, overall appearance is relatively stable as compared to 2015, no other new or progressive symptoms, this MRI was compared to prior radiographs from December 2019 and MRI from June 2015, this MRI was completed March 2020.  Review of Systems: Patient complains of symptoms per HPI as well as the following symptoms: headache, low back pain. Pertinent negatives and positives per HPI. All others negative.   Social History   Socioeconomic History  . Marital status: Single    Spouse name: Not on file  . Number of children: 2  . Years of education: Not on file  . Highest education level: Not on file  Occupational History  . Occupation: medical asst  Tobacco Use  . Smoking status: Never Smoker  . Smokeless tobacco: Never Used  Substance and Sexual Activity  . Alcohol use: No    Alcohol/week: 0.0 standard drinks  . Drug use: No  . Sexual activity: Yes    Birth control/protection: Pill  Other Topics Concern  . Not on file  Social History Narrative   No regular exercise.     Lives at home with kids    Caffeine: daily, 12 oz soda every morning   Right handed   Social Determinants of Health   Financial Resource Strain:   . Difficulty of Paying Living Expenses: Not on file  Food Insecurity:   .  Worried About Programme researcher, broadcasting/film/video in the Last Year: Not on file  . Ran Out of Food in the Last Year: Not on file  Transportation Needs:   . Lack of Transportation (Medical): Not on file  . Lack of Transportation (Non-Medical): Not on file  Physical Activity:   . Days of Exercise per Week: Not on file  . Minutes of Exercise per Session: Not on file  Stress:   . Feeling of Stress : Not on file  Social Connections:   . Frequency of Communication with Friends and Family: Not on file  . Frequency of  Social Gatherings with Friends and Family: Not on file  . Attends Religious Services: Not on file  . Active Member of Clubs or Organizations: Not on file  . Attends Banker Meetings: Not on file  . Marital Status: Not on file  Intimate Partner Violence:   . Fear of Current or Ex-Partner: Not on file  . Emotionally Abused: Not on file  . Physically Abused: Not on file  . Sexually Abused: Not on file    Family History  Problem Relation Age of Onset  . Hypertension Mother   . Diabetes Mother   . Hypertension Maternal Grandmother   . Diabetes Maternal Grandmother   . Kidney disease Maternal Grandmother   . Rheum arthritis Paternal Grandmother   . Hypertension Paternal Grandmother   . Breast cancer Paternal Aunt   . Stroke Father        after surgery SDH  . Other Father        SDH- spontaneous 2020, had burr holes, recurrent SDH craniotomy  . Migraines Neg Hx     Past Medical History:  Diagnosis Date  . Abnormal Pap smear of cervix    ASCUS + HPV and neg Colpo  . Adjustment disorder with mixed anxiety and depressed mood   . Attention deficit disorder predominant inattentive type   . Depression    effexor  . GAD (generalized anxiety disorder)   . Migraines   . Obesity   . Osteoarthritis   . Sleep apnea    obstructive, controlled on CPAP  . Spinal stenosis   . Vitamin D deficiency     Patient Active Problem List   Diagnosis Date Noted  . Chronic migraine without aura without status migrainosus, not intractable 03/25/2019  . Left ankle injury 03/01/2015  . Migraine 10/20/2010  . OBESITY 06/03/2010  . Anxiety state, unspecified 06/03/2010  . SLEEP APNEA 06/03/2010  . UNSPECIFIED VITAMIN D DEFICIENCY 05/10/2010    Past Surgical History:  Procedure Laterality Date  . BREAST SURGERY  2011   Breast reduction  . CESAREAN SECTION     x2  . COLPOSCOPY    . LTCS  2005   pt does not recall this     Current Outpatient Medications  Medication Sig  Dispense Refill  . amitriptyline (ELAVIL) 25 MG tablet Take 25 mg by mouth at bedtime.    Marland Kitchen amphetamine-dextroamphetamine (ADDERALL) 20 MG tablet Take 1 tablet by mouth daily.    . citalopram (CELEXA) 40 MG tablet TAKE 1/2 TABLET BY MOUTH DAILY FOR 1 WEEK, THEN 1 TABLET DAILY 30 tablet 1  . diphenhydrAMINE (BENADRYL) 25 MG tablet Take 1 tablet (25 mg total) by mouth every 6 (six) hours. 20 tablet 0  . fexofenadine (ALLEGRA) 180 MG tablet Take 180 mg by mouth daily.    Marland Kitchen gabapentin (NEURONTIN) 100 MG capsule Takes 1 -300 at bedtime    .  ibuprofen (ADVIL) 800 MG tablet Take 1 tablet (800 mg total) by mouth 3 (three) times daily. 21 tablet 0  . ibuprofen (ADVIL,MOTRIN) 800 MG tablet Take 1 tablet (800 mg total) by mouth 3 (three) times daily. (Patient taking differently: Take 800 mg by mouth as needed. ) 21 tablet 0  . Goodman-Lysine 500 MG TABS Take 500 mg by mouth daily.    . metoCLOPramide (REGLAN) 10 MG tablet Take 1 tablet (10 mg total) by mouth every 6 (six) hours as needed for nausea (nausea/headache). 6 tablet 0  . norethindrone-ethinyl estradiol (MICROGESTIN,JUNEL,LOESTRIN) 1-20 MG-MCG tablet Take 1 tablet by mouth daily.    . norethindrone-ethinyl estradiol-iron (MICROGESTIN FE,GILDESS FE,LOESTRIN FE) 1.5-30 MG-MCG tablet Take 1 tablet by mouth daily.    . SUMAtriptan (IMITREX) 50 MG tablet Take one at onset of headache, may repeat after two hours if needed    . Fremanezumab-vfrm (AJOVY) 225 MG/1.5ML SOAJ Inject 225 mg into the skin every 30 (thirty) days. 1 pen 11  . Ubrogepant (UBRELVY) 100 MG TABS Take 100 mg by mouth every 2 (two) hours as needed. Maximum 200mg  a day. 4 tablet 0   No current facility-administered medications for this visit.    Allergies as of 03/25/2019 - Review Complete 03/25/2019  Allergen Reaction Noted  . Oxycodone-acetaminophen Hives and Itching 03/01/2015  . Penicillins      Vitals: BP 127/88 (BP Location: Right Arm, Patient Position: Sitting, Cuff Size: Large)    Pulse 83   Temp (!) 97.5 F (36.4 C) Comment: taken at front  Ht 5\' 4"  (1.626 m)   Wt 298 lb (135.2 kg)   Breastfeeding No Comment: takes OC continuously   BMI 51.15 kg/m  Last Weight:  Wt Readings from Last 1 Encounters:  03/25/19 298 lb (135.2 kg)   Last Height:   Ht Readings from Last 1 Encounters:  03/25/19 5\' 4"  (1.626 m)     Physical exam: Exam: Gen: NAD, conversant, well nourised, obese, well groomed                     CV: RRR, no MRG. No Carotid Bruits. No peripheral edema, warm, nontender Eyes: Conjunctivae clear without exudates or hemorrhage  Neuro: Detailed Neurologic Exam  Speech:    Speech is normal; fluent and spontaneous with normal comprehension.  Cognition:    The patient is oriented to person, place, and time;     recent and remote memory intact;     language fluent;     normal attention, concentration,     fund of knowledge Cranial Nerves:    The pupils are equal, round, and reactive to light. The fundi are normal and spontaneous venous pulsations are present. Visual fields are full to finger confrontation. Extraocular movements are intact. Trigeminal sensation is intact and the muscles of mastication are normal. The face is symmetric. The palate elevates in the midline. Hearing intact. Voice is normal. Shoulder shrug is normal. The tongue has normal motion without fasciculations.   Coordination:    Normal finger to nose and heel to shin. Normal rapid alternating movements.   Gait:    Heel-toe and tandem gait are normal.   Motor Observation:    No asymmetry, no atrophy, and no involuntary movements noted. Tone:    Normal muscle tone.    Posture:    Posture is normal. normal erect    Strength:    Strength is V/V in the upper and lower limbs.      Sensation:  intact to LT     Reflex Exam:  DTR's:    Hypo right Aj as compared to the lest. Deep tendon reflexes in the upper and lower extremities are normal bilaterally.   Toes:    The toes  are downgoing bilaterally.   Clonus:    Clonus is absent.    Assessment/Plan:  Patient with chronic migraines and right> left L5/S1 radiculopathy  Bilat S1 Radic: Odette Fraction, did well with injections in the past, [facility].  Failed multiple classes of meds for migraines: Start Botox. Start Ajovy.  Acute management: Try samples of Ubrelvy, can use imitrex as well  If headaches do not improve need MRI brain  Discussed: To prevent or relieve headaches, try the following: Cool Compress. Lie down and place a cool compress on your head.  Avoid headache triggers. If certain foods or odors seem to have triggered your migraines in the past, avoid them. A headache diary might help you identify triggers.  Include physical activity in your daily routine. Try a daily walk or other moderate aerobic exercise.  Manage stress. Find healthy ways to cope with the stressors, such as delegating tasks on your to-do list.  Practice relaxation techniques. Try deep breathing, yoga, massage and visualization.  Eat regularly. Eating regularly scheduled meals and maintaining a healthy diet might help prevent headaches. Also, drink plenty of fluids.  Follow a regular sleep schedule. Sleep deprivation might contribute to headaches Consider biofeedback. With this mind-body technique, you learn to control certain bodily functions - such as muscle tension, heart rate and blood pressure - to prevent headaches or reduce headache pain.    Proceed to emergency room if you experience new or worsening symptoms or symptoms do not resolve, if you have new neurologic symptoms or if headache is severe, or for any concerning symptom.   Provided education and documentation from American headache Society toolbox including articles on: chronic migraine medication overuse headache, chronic migraines, prevention of migraines, behavioral and other nonpharmacologic treatments for headache.    Meds ordered this encounter   Medications  . Fremanezumab-vfrm (AJOVY) 225 MG/1.5ML SOAJ    Sig: Inject 225 mg into the skin every 30 (thirty) days.    Dispense:  1 pen    Refill:  11    Patient has copay card; she can have medication for $5 regardless of insurance approval or copay amount.  Marland Kitchen Ubrogepant (UBRELVY) 100 MG TABS    Sig: Take 100 mg by mouth every 2 (two) hours as needed. Maximum 200mg  a day.    Dispense:  4 tablet    Refill:  0    Cc: , NP  Gabrielle Hansen, MD  [facility] [address] [place], [place]  Phone [phone] Fax [phone]

## 2019-03-25 NOTE — Telephone Encounter (Signed)
Completed Ajovy PA on CMM. Key: [id number]. Awaiting [employer] determination.

## 2019-03-25 NOTE — Telephone Encounter (Signed)
Gabrielle Goodman, Patient is UMR, please schedule appointment for botox with me first available and call patient to discuss process. thanks

## 2019-03-25 NOTE — Telephone Encounter (Signed)
botox charge sheet signed by AA, MD and given to Danielle.  

## 2019-03-25 NOTE — Telephone Encounter (Signed)
Botox charge sheet completed, pending MD signature. Pt will need to sign consent at first visit. I also spoke with Dr. Jaynee Eagles and she said the patient can be scheduled with Amy NP.

## 2019-03-26 MED FILL — AJOVY 225 MG/1.5ML SOAJ: 225 | 30 days supply | Qty: 2 | Fill #0

## 2019-03-27 ENCOUNTER — Encounter: Payer: Self-pay | Admitting: *Deleted

## 2019-03-27 NOTE — Telephone Encounter (Signed)
Received denial from [place]. Pt required to try/fail Aimovig and Emgality first. Pt can use the savings card to get the Ajovy. I messaged pt in [place].   If we should choose to appeal the decision, call [phone] or fax to [phone].   PA reference # [id number]

## 2019-03-28 ENCOUNTER — Encounter: Payer: 59 | Admitting: Physical Therapy

## 2019-03-31 ENCOUNTER — Encounter: Payer: 59 | Admitting: Physical Therapy

## 2019-04-02 ENCOUNTER — Encounter: Payer: 59 | Admitting: Physical Therapy

## 2019-04-02 MED FILL — valACYclovir HCL 1 GM TABS: 1 | 3 days supply | Qty: 6 | Fill #0

## 2019-04-08 ENCOUNTER — Encounter: Payer: 59 | Admitting: Physical Therapy

## 2019-04-09 NOTE — Telephone Encounter (Signed)
Gabrielle Goodman will be back On Monday Patient is not on schedule. Gabrielle Goodman I will call patient and let her know you are out and it will be processed.   Patient didn't answer I left her a voice mail.

## 2019-04-10 ENCOUNTER — Encounter: Payer: 59 | Admitting: Physical Therapy

## 2019-04-14 MED FILL — AMPHETAMINE-DEXTROAMPHETAMI: 20 | 30 days supply | Qty: 30 | Fill #0

## 2019-04-17 NOTE — Telephone Encounter (Signed)
Apt has been scheduled and patient was made aware through [employer]. DWD

## 2019-04-21 MED FILL — AJOVY 225 MG/1.5ML SOAJ: 225 | 30 days supply | Qty: 2 | Fill #1

## 2019-04-24 ENCOUNTER — Ambulatory Visit: Payer: 59 | Admitting: Neurology

## 2019-04-24 ENCOUNTER — Encounter: Payer: Self-pay | Admitting: *Deleted

## 2019-05-05 ENCOUNTER — Other Ambulatory Visit: Payer: Self-pay

## 2019-05-05 ENCOUNTER — Ambulatory Visit (INDEPENDENT_AMBULATORY_CARE_PROVIDER_SITE_OTHER): Payer: 59 | Admitting: Neurology

## 2019-05-05 VITALS — Temp 97.8°F

## 2019-05-05 DIAGNOSIS — G43709 Chronic migraine without aura, not intractable, without status migrainosus: Secondary | ICD-10-CM

## 2019-05-05 DIAGNOSIS — F341 Dysthymic disorder: Secondary | ICD-10-CM | POA: Diagnosis not present

## 2019-05-05 DIAGNOSIS — F331 Major depressive disorder, recurrent, moderate: Secondary | ICD-10-CM | POA: Diagnosis not present

## 2019-05-05 MED FILL — FLUoxetine HCL 10 MG CAPS: 10 | 30 days supply | Qty: 60 | Fill #0

## 2019-05-05 MED FILL — CITALOPRAM HBR 10 MG TABLET: 10 | 30 days supply | Qty: 30 | Fill #0

## 2019-05-05 NOTE — Progress Notes (Signed)
First botox, 12 migraine days a month and daily headaches, started on Ajovy at last appointment and today botox.   Consent Form Botulism Toxin Injection For Chronic Migraine    Reviewed orally with patient, additionally signature is on file:  Botulism toxin has been approved by the Federal drug administration for treatment of chronic migraine. Botulism toxin does not cure chronic migraine and it may not be effective in some patients.  The administration of botulism toxin is accomplished by injecting a small amount of toxin into the muscles of the neck and head. Dosage must be titrated for each individual. Any benefits resulting from botulism toxin tend to wear off after 3 months with a repeat injection required if benefit is to be maintained. Injections are usually done every 3-4 months with maximum effect peak achieved by about 2 or 3 weeks. Botulism toxin is expensive and you should be sure of what costs you will incur resulting from the injection.  The side effects of botulism toxin use for chronic migraine may include:   -Transient, and usually mild, facial weakness with facial injections  -Transient, and usually mild, head or neck weakness with head/neck injections  -Reduction or loss of forehead facial animation due to forehead muscle weakness  -Eyelid drooping  -Dry eye  -Pain at the site of injection or bruising at the site of injection  -Double vision  -Potential unknown long term risks  Contraindications: You should not have Botox if you are pregnant, nursing, allergic to albumin, have an infection, skin condition, or muscle weakness at the site of the injection, or have myasthenia gravis, Lambert-Eaton syndrome, or ALS.  It is also possible that as with any injection, there may be an allergic reaction or no effect from the medication. Reduced effectiveness after repeated injections is sometimes seen and rarely infection at the injection site may occur. All care will be taken to  prevent these side effects. If therapy is given over a long time, atrophy and wasting in the muscle injected may occur. Occasionally the patient's become refractory to treatment because they develop antibodies to the toxin. In this event, therapy needs to be modified.  I have read the above information and consent to the administration of botulism toxin.    BOTOX PROCEDURE NOTE FOR MIGRAINE HEADACHE    Contraindications and precautions discussed with patient(above). Aseptic procedure was observed and patient tolerated procedure. Procedure performed by Dr. Artemio Aly  The condition has existed for more than 6 months, and pt does not have a diagnosis of ALS, Myasthenia Gravis or Lambert-Eaton Syndrome.  Risks and benefits of injections discussed and pt agrees to proceed with the procedure.  Written consent obtained  These injections are medically necessary. Pt  receives good benefits from these injections. These injections do not cause sedations or hallucinations which the oral therapies may cause.  Description of procedure:  The patient was placed in a sitting position. The standard protocol was used for Botox as follows, with 5 units of Botox injected at each site:   -Procerus muscle, midline injection  -Corrugator muscle, bilateral injection  -Frontalis muscle, bilateral injection, with 2 sites each side, medial injection was performed in the upper one third of the frontalis muscle, in the region vertical from the medial inferior edge of the superior orbital rim. The lateral injection was again in the upper one third of the forehead vertically above the lateral limbus of the cornea, 1.5 cm lateral to the medial injection site.  -Temporalis muscle injection, 4 sites,  bilaterally. The first injection was 3 cm above the tragus of the ear, second injection site was 1.5 cm to 3 cm up from the first injection site in line with the tragus of the ear. The third injection site was 1.5-3 cm forward  between the first 2 injection sites. The fourth injection site was 1.5 cm posterior to the second injection site.   -Occipitalis muscle injection, 3 sites, bilaterally. The first injection was done one half way between the occipital protuberance and the tip of the mastoid process behind the ear. The second injection site was done lateral and superior to the first, 1 fingerbreadth from the first injection. The third injection site was 1 fingerbreadth superiorly and medially from the first injection site.  -Cervical paraspinal muscle injection, 2 sites, bilateral knee first injection site was 1 cm from the midline of the cervical spine, 3 cm inferior to the lower border of the occipital protuberance. The second injection site was 1.5 cm superiorly and laterally to the first injection site.  -Trapezius muscle injection was performed at 3 sites, bilaterally. The first injection site was in the upper trapezius muscle halfway between the inflection point of the neck, and the acromion. The second injection site was one half way between the acromion and the first injection site. The third injection was done between the first injection site and the inflection point of the neck.   Will return for repeat injection in 3 months.   200 units of Botox was used, any Botox not injected was wasted. The patient tolerated the procedure well, there were no complications of the above procedure.

## 2019-05-05 NOTE — Progress Notes (Signed)
Botox- 200 units x 1 vial Lot: [id number] Expiration: 01/2022 NDC: 2542-7062-37  Bacteriostatic 0.9% Sodium Chloride- 26mL total Lot: [id number] Expiration: 07/10/2019 NDC: 1761-6073-71  Dx: G62.694 B/B

## 2019-05-13 MED FILL — LARIN FE 1.5-30 TABLET: 1.5-30 | 63 days supply | Qty: 84 | Fill #2

## 2019-05-13 MED FILL — AMPHETAMINE-DEXTROAMPHETAMI: 20 | 30 days supply | Qty: 30 | Fill #0

## 2019-05-14 DIAGNOSIS — M5126 Other intervertebral disc displacement, lumbar region: Secondary | ICD-10-CM | POA: Diagnosis not present

## 2019-05-14 DIAGNOSIS — M5416 Radiculopathy, lumbar region: Secondary | ICD-10-CM | POA: Diagnosis not present

## 2019-05-15 MED FILL — CYANOCOBALAMIN 1,000 MCG/ML: 1000 | 90 days supply | Qty: 3 | Fill #1

## 2019-05-26 DIAGNOSIS — E538 Deficiency of other specified B group vitamins: Secondary | ICD-10-CM | POA: Diagnosis not present

## 2019-05-26 DIAGNOSIS — R7303 Prediabetes: Secondary | ICD-10-CM | POA: Diagnosis not present

## 2019-05-26 DIAGNOSIS — Z0000 Encounter for general adult medical examination without abnormal findings: Secondary | ICD-10-CM | POA: Diagnosis not present

## 2019-05-26 DIAGNOSIS — Z1159 Encounter for screening for other viral diseases: Secondary | ICD-10-CM | POA: Diagnosis not present

## 2019-05-26 DIAGNOSIS — E559 Vitamin D deficiency, unspecified: Secondary | ICD-10-CM | POA: Diagnosis not present

## 2019-05-27 MED FILL — AJOVY 225 MG/1.5ML SOAJ: 225 | 30 days supply | Qty: 2 | Fill #2

## 2019-05-27 MED FILL — SODIUM FLUORIDE 5000 PPM 1.: 1.1 | 30 days supply | Qty: 100 | Fill #0

## 2019-06-02 NOTE — Telephone Encounter (Signed)
Received another pa request from [facility] outpatient pharmacy stating they could not use savings card. I completed PA on Cover My Meds. Key: [id number]. I faxed a message to pharmacy with override instructions from savings card. Received a receipt of confirmation.

## 2019-06-04 MED FILL — PRAMIPEXOLE 0.125 MG TABLET: 0.125 | 30 days supply | Qty: 69 | Fill #0

## 2019-06-05 NOTE — Telephone Encounter (Signed)
Ajovy denied by [employer] d/t requirement of previous trial of Aimovig and Emgality or a valid medical reason why pt cannot take those. Pt can continue to use the savings card to get this medication monthly until card expiration. If we should choose to appeal, fax within 180 days to [phone]. PA reference number [id number].

## 2019-06-18 MED FILL — AJOVY 225 MG/1.5ML SOAJ: 225 | 30 days supply | Qty: 2 | Fill #3

## 2019-06-19 MED FILL — AMPHETAMINE-DEXTROAMPHETAMI: 20 | 30 days supply | Qty: 30 | Fill #0

## 2019-06-30 MED FILL — GABAPENTIN 300 MG CAPSULE: 300 | 30 days supply | Qty: 90 | Fill #1

## 2019-06-30 MED FILL — CITALOPRAM HBR 40 MG TABLET: 40 | 90 days supply | Qty: 90 | Fill #1

## 2019-07-04 MED FILL — PRAMIPEXOLE 0.125 MG TABLET: 0.125 | 30 days supply | Qty: 90 | Fill #1

## 2019-07-19 MED FILL — AJOVY 225 MG/1.5ML SOAJ: 225 | 30 days supply | Qty: 2 | Fill #4

## 2019-07-21 MED FILL — LARIN FE 1.5-30 TABLET: 1.5-30 | 84 days supply | Qty: 112 | Fill #0

## 2019-07-21 MED FILL — AMPHETAMINE-DEXTROAMPHETAMI: 20 | 30 days supply | Qty: 30 | Fill #0

## 2019-07-29 ENCOUNTER — Ambulatory Visit: Payer: 59 | Admitting: Neurology

## 2019-09-02 ENCOUNTER — Ambulatory Visit (INDEPENDENT_AMBULATORY_CARE_PROVIDER_SITE_OTHER)
Admission: RE | Admit: 2019-09-02 | Discharge: 2019-09-02 | Disposition: A | Discharge status: Home / Self Care | Payer: 59 | Source: Ambulatory Visit

## 2019-09-02 ENCOUNTER — Encounter (HOSPITAL_COMMUNITY): Payer: Self-pay

## 2019-09-02 ENCOUNTER — Other Ambulatory Visit: Payer: Self-pay

## 2019-09-02 DIAGNOSIS — J0110 Acute frontal sinusitis, unspecified: Secondary | ICD-10-CM | POA: Diagnosis not present

## 2019-09-02 DIAGNOSIS — J029 Acute pharyngitis, unspecified: Secondary | ICD-10-CM

## 2019-09-02 DIAGNOSIS — R059 Cough, unspecified: Secondary | ICD-10-CM

## 2019-09-02 DIAGNOSIS — R0981 Nasal congestion: Secondary | ICD-10-CM

## 2019-09-02 DIAGNOSIS — R05 Cough: Secondary | ICD-10-CM | POA: Diagnosis not present

## 2019-09-02 MED ORDER — SULFAMETHOXAZOLE-TRIMETHOPRIM 800-160 MG PO TABS: 1.0000 | ORAL_TABLET | Freq: Two times a day (BID) | ORAL | 0 refills | Status: AC

## 2019-09-02 MED FILL — SULFAMETHOXAZOLE-TMP DS TAB: 800-160 | 7 days supply | Qty: 14 | Fill #0

## 2019-09-02 NOTE — Discharge Instructions (Signed)
You have a sinus infection.  I have prescribed Bactrim twice a day for 7 days.  Follow up with this office or with primary care if you are not feeling better over the next 2 days.  Follow up with the ER for trouble swallowing, trouble breathing, other concerning symptoms.

## 2019-09-02 NOTE — ED Provider Notes (Signed)
[facility]ARE CENTER  Virtual Visit via Video Note:  Gabrielle Goodman  initiated request for Telemedicine visit with [facility] team. I connected with Gabrielle Goodman  on 09/02/2019 at 12:40 PM  for a synchronized telemedicine visit using a video enabled HIPPA compliant telemedicine application. I verified that I am speaking with Gabrielle Goodman  using two identifiers. Gabrielle Lex, NP  was physically located in a [facility]gent care site and Gabrielle Goodman was located at a different location.   The limitations of evaluation and management by telemedicine as well as the availability of in-person appointments were discussed. Patient was informed that she  may incur a bill ( including co-pay) for this virtual visit encounter. Gabrielle Goodman  expressed understanding and gave verbal consent to proceed with virtual visit.   [id number] 09/02/19 Arrival Time: 1228  ZD:GLOV THROAT  SUBJECTIVE: History from: patient.  Gabrielle Goodman is a 39 y.o. female who presents with abrupt onset of sore throat, nasal congestion, and cough for one week. She reports that she has also been experiencing sinus pain and headache as well. Reports that she has had congestion for the last week. Reports that she is now coughing up yellow/green sputum. Has tried Allegra without relief. There are no aggravating factors. Reports previous symptoms in the past.     Denies fever, chills, fatigue, ear pain, rhinorrhea,  SOB, wheezing, chest pain, nausea, rash, changes in bowel or bladder habits.    ROS: As per HPI.  All other pertinent ROS negative.    Past Medical History:  Diagnosis Date  . Abnormal Pap smear of cervix    ASCUS + HPV and neg Colpo  . Adjustment disorder with mixed anxiety and depressed mood   . Attention deficit disorder predominant inattentive type   . Depression    effexor  . GAD (generalized anxiety disorder)   . Migraines   . Obesity   . Osteoarthritis   . Sleep apnea    obstructive,  controlled on CPAP  . Spinal stenosis   . Vitamin D deficiency    Past Surgical History:  Procedure Laterality Date  . BREAST SURGERY  2011   Breast reduction  . CESAREAN SECTION     x2  . COLPOSCOPY    . LTCS  2005   pt does not recall this    Allergies  Allergen Reactions  . Oxycodone-Acetaminophen Hives and Itching  . Penicillins    No current facility-administered medications on file prior to encounter.   Current Outpatient Medications on File Prior to Encounter  Medication Sig Dispense Refill  . amitriptyline (ELAVIL) 25 MG tablet Take 25 mg by mouth at bedtime.    Marland Kitchen amphetamine-dextroamphetamine (ADDERALL) 20 MG tablet Take 1 tablet by mouth daily.    . citalopram (CELEXA) 40 MG tablet TAKE 1/2 TABLET BY MOUTH DAILY FOR 1 WEEK, THEN 1 TABLET DAILY 30 tablet 1  . Cyanocobalamin (VITAMIN B-12 IJ) Inject 1,000 mcg as directed every 30 (thirty) days.    . diphenhydrAMINE (BENADRYL) 25 MG tablet Take 1 tablet (25 mg total) by mouth every 6 (six) hours. 20 tablet 0  . fexofenadine (ALLEGRA) 180 MG tablet Take 180 mg by mouth daily.    . Fremanezumab-vfrm (AJOVY) 225 MG/1.5ML SOAJ Inject 225 mg into the skin every 30 (thirty) days. 1 pen 11  . gabapentin (NEURONTIN) 100 MG capsule Takes 1 -300 at bedtime    . L-Lysine 500 MG TABS Take 500 mg by mouth daily.    Marland Kitchen  norethindrone-ethinyl estradiol (MICROGESTIN,JUNEL,LOESTRIN) 1-20 MG-MCG tablet Take 1 tablet by mouth daily.    Marland Kitchen OVER THE COUNTER MEDICATION daily. Super C (Vitamin C, D, & Zinc)    . SUMAtriptan (IMITREX) 50 MG tablet Take one at onset of headache, may repeat after two hours if needed    . Ubrogepant (UBRELVY) 100 MG TABS Take 100 mg by mouth every 2 (two) hours as needed. Maximum 200mg  a day. 4 tablet 0   Social History   Socioeconomic History  . Marital status: Single    Spouse name: Not on file  . Number of children: 2  . Years of education: Not on file  . Highest education level: Not on file  Occupational  History  . Occupation: medical asst  Tobacco Use  . Smoking status: Never Smoker  . Smokeless tobacco: Never Used  Substance and Sexual Activity  . Alcohol use: No    Alcohol/week: 0.0 standard drinks  . Drug use: No  . Sexual activity: Yes    Birth control/protection: Pill  Other Topics Concern  . Not on file  Social History Narrative   No regular exercise.     Lives at home with kids    Caffeine: daily, 12 oz soda every morning   Right handed   Social Determinants of Health   Financial Resource Strain:   . Difficulty of Paying Living Expenses:   Food Insecurity:   . Worried About in the Last Year:   . Programme researcher, broadcasting/film/video in the Last Year:   Transportation Needs:   . Barista (Medical):   Freight forwarder Lack of Transportation (Non-Medical):   Physical Activity:   . Days of Exercise per Week:   . Minutes of Exercise per Session:   Stress:   . Feeling of Stress :   Social Connections:   . Frequency of Communication with Friends and Family:   . Frequency of Social Gatherings with Friends and Family:   . Attends Religious Services:   . Active Member of Clubs or Organizations:   . Attends Marland Kitchen Meetings:   Banker Marital Status:   Intimate Partner Violence:   . Fear of Current or Ex-Partner:   . Emotionally Abused:   Marland Kitchen Physically Abused:   . Sexually Abused:    Family History  Problem Relation Age of Onset  . Hypertension Mother   . Diabetes Mother   . Hypertension Maternal Grandmother   . Diabetes Maternal Grandmother   . Kidney disease Maternal Grandmother   . Rheum arthritis Paternal Grandmother   . Hypertension Paternal Grandmother   . Breast cancer Paternal Aunt   . Stroke Father        after surgery SDH  . Other Father        SDH- spontaneous 2020, had burr holes, recurrent SDH craniotomy  . Migraines Neg Hx     OBJECTIVE:   There were no vitals filed for this visit.  General appearance: alert; no distress Eyes: EOMI  grossly HENT: normocephalic; atraumatic Neck: supple with FROM Lungs: normal respiratory effort; speaking in full sentences without difficulty Extremities: moves extremities without difficulty Skin: No obvious rashes Neurologic: No facial asymmetries Psychological: alert and cooperative; normal mood and affect  ASSESSMENT & PLAN:  No diagnosis found.  Meds ordered this encounter  Medications  . sulfamethoxazole-trimethoprim (BACTRIM DS) 800-160 MG tablet    Sig: Take 1 tablet by mouth 2 (two) times daily for 7 days.    Dispense:  14 tablet    Refill:  0    Order Specific Question:   Supervising Provider    Answer:   Chase Picket [id number]    Acute Sinusitis Push fluids and get rest Prescribed Bactrim twice daily for 7 days.  Take as directed and to completion.  Drink warm or cool liquids, use throat lozenges, or popsicles to help alleviate symptoms Take OTC ibuprofen or tylenol as needed for pain Follow up with PCP if symptoms persist Return or go to ER if you have any new or worsening symptoms such as fever, chills, nausea, vomiting, worsening sore throat, cough, abdominal pain, chest pain, changes in bowel or bladder habits  I discussed the assessment and treatment plan with the patient. The patient was provided an opportunity to ask questions and all were answered. The patient agreed with the plan and demonstrated an understanding of the instructions.   The patient was advised to call back or seek an in-person evaluation if the symptoms worsen or if the condition fails to improve as anticipated.  I provided 10 minutes of non-face-to-face time during this encounter.  Bettey Mare, NP  09/02/2019 12:39 PM         Faustino Congress, NP 09/02/19 1240

## 2019-09-22 MED FILL — AMPHETAMINE-DEXTROAMPHETAMI: 20 | 30 days supply | Qty: 30 | Fill #0

## 2019-09-22 MED FILL — CITALOPRAM HBR 40 MG TABLET: 40 | 90 days supply | Qty: 90 | Fill #0

## 2019-09-23 ENCOUNTER — Ambulatory Visit (INDEPENDENT_AMBULATORY_CARE_PROVIDER_SITE_OTHER): Payer: 59 | Admitting: *Deleted

## 2019-09-23 DIAGNOSIS — Z0289 Encounter for other administrative examinations: Secondary | ICD-10-CM

## 2019-09-23 DIAGNOSIS — G43709 Chronic migraine without aura, not intractable, without status migrainosus: Secondary | ICD-10-CM

## 2019-09-23 MED ORDER — AJOVY 225 MG/1.5ML ~~LOC~~ SOAJ
225.0000 mg | SUBCUTANEOUS | 0 refills | Status: DC
Start: 2019-09-23 — End: 2019-11-18

## 2019-09-23 NOTE — Addendum Note (Signed)
Addended by: Bertram Savin on: 09/23/2019 04:48 PM   Modules accepted: Orders

## 2019-09-23 NOTE — Progress Notes (Signed)
Pt was given 2 samples of Ajovy 225 mg pens per v.o. Dr. Lucia Gaskins. Pt verbalized appreciation.

## 2019-09-23 NOTE — Telephone Encounter (Signed)
Order placed for 2 sample Ajovy pens.

## 2019-11-12 MED ORDER — AJOVY 225 MG/1.5ML ~~LOC~~ SOAJ: 225.0000 mg | SUBCUTANEOUS | 3 refills | Status: DC

## 2019-11-18 DIAGNOSIS — G43709 Chronic migraine without aura, not intractable, without status migrainosus: Secondary | ICD-10-CM

## 2019-11-18 MED ORDER — AJOVY 225 MG/1.5ML ~~LOC~~ SOAJ: 225.0000 mg | SUBCUTANEOUS | 5 refills | Status: DC

## 2019-11-20 ENCOUNTER — Telehealth: Payer: Self-pay | Admitting: *Deleted

## 2019-11-20 NOTE — Telephone Encounter (Signed)
Per Cover My Meds, PA Case: [id number], Status: Approved, Coverage Starts on: 11/20/2019 12:00:00 AM, Coverage Ends on: 02/18/2020 12:00:00 AM.

## 2019-11-20 NOTE — Telephone Encounter (Signed)
Ajovy PA completed on Cover My Meds. Key: [id number]. Awaiting determination from [place].

## 2019-12-17 ENCOUNTER — Ambulatory Visit: Payer: Self-pay | Admitting: Adult Health

## 2020-02-02 ENCOUNTER — Encounter: Payer: Self-pay | Admitting: Adult Health

## 2020-02-02 ENCOUNTER — Ambulatory Visit: Payer: Managed Care, Other (non HMO) | Admitting: Adult Health

## 2020-02-02 ENCOUNTER — Other Ambulatory Visit: Payer: Self-pay

## 2020-02-02 VITALS — BP 143/93 | HR 104 | Ht 65.0 in | Wt 288.0 lb

## 2020-02-02 DIAGNOSIS — G4733 Obstructive sleep apnea (adult) (pediatric): Secondary | ICD-10-CM | POA: Diagnosis not present

## 2020-02-02 DIAGNOSIS — G43709 Chronic migraine without aura, not intractable, without status migrainosus: Secondary | ICD-10-CM | POA: Diagnosis not present

## 2020-02-02 DIAGNOSIS — G4719 Other hypersomnia: Secondary | ICD-10-CM | POA: Diagnosis not present

## 2020-02-02 DIAGNOSIS — G44221 Chronic tension-type headache, intractable: Secondary | ICD-10-CM

## 2020-02-02 MED ORDER — METHYLPREDNISOLONE 4 MG PO TBPK: ORAL_TABLET | ORAL | 0 refills | Status: AC

## 2020-02-02 MED ORDER — AJOVY 225 MG/1.5ML ~~LOC~~ SOAJ: 225.0000 mg | SUBCUTANEOUS | 5 refills | Status: DC

## 2020-02-02 MED ORDER — UBRELVY 100 MG PO TABS: 100.0000 mg | ORAL_TABLET | ORAL | 3 refills | Status: AC | PRN

## 2020-02-02 NOTE — Progress Notes (Addendum)
[facility]    Provider:  Dr Lucia GaskinsAhern Requesting Provider: Iona Goodman, Gabrielle L, NP Primary Care Provider:  Iona Goodman, Gabrielle L, NP  CC:  Migraine  HPI:   Today, 02/02/2020, Gabrielle Goodman returns for migraine follow-up.  Initial consult visit with Dr. Lucia GaskinsAhern in 03/2019 with 12 migraine days a month and daily headaches.  She received Botox injections on 05/05/2019 but reports having intermittent left facial "nerve pain" post injections for approx 3 months. She has not experienced these in the past couple of months.  She remains on monthly Ajovy injections. Ajovy has greatly decreased migraine frequency. She did have a migraine with recent covid infection but had not experienced a migraine for months prior to COVID infection.  She did use ubrevely samples with benefit. She has not used imitrex as she reports making headaches worse and difficulty tolerating.  Since her recent Covid infection approximately 1 month ago, she has been experiencing a daily tension type headache which can fluctuate with severity and will use Excedrin when headaches start interfering with daily activity. She does admit to not using CPAP over the past couple of months as "it was not helping" in regards to her daily fatigue.  She was compliant with CPAP for over 1 year.  No further concerns at this time.    History provided for reference purposes only Initial visit 03/25/2019 Dr. Lucia GaskinsAhern:  Gabrielle Goodman is a 39 y.o. female here as requested by Gabrielle Goodman, Gabrielle L, NP for migraines. She was also referred for sleep apnea and is in the process of getting that scheduled. PMHX severe obesity, ADHD, adjustment disorder with mixed anxiety, migraine was well controlled on propranolol but too sedating, sleep apnea controlled on CPAP, migraines, lumbar spinal stenoses and radiculopathy, OSA, neuropathy right leg.  I reviewed Gabrielle Kerbsenny Jones's note.  Patient's been trying to lose weight.  She is trying to walk.  Patient has a history of migraines  and has actually been to the emergency room, she was started on steroids and Benadryl, no headaches since then, she takes Adderall daily for work, saw neuro and has a CPAP now for obstructive sleep apnea, she is tried amitriptyline, gabapentin and increasing this for problems with her low back.  She has had migraines for most of her life, started as a teenager, she tried many things throughout the years, she tried Topamax and she could not function on it. They have worsened. She has 12 migraine days a month. She has daily headaches. Mostly on the left, behind the eye and radiates down the back and to the neck. Imitrex makes her tired. She is on gabapentin, amitriptyline. Also celexa. Pulsating, pounding, throbbing, movement makes it worse, light.sound, nausea and vomiting, can last 24-72 on average and one lasted all week and went to the ED. Also tried propranolol. She uses her cpap regularly, auto pap. She also has sciatic pain, she has had ESIs which helped but the pain came back. One ESI helps for a year. Pain radiates and burnins into the lower spn, stays around the lower back and can radiate down the back.   Reviewed notes, labs and imaging from outside physicians, which showed:  MRI of the lumbar spine, shallow right subarticular disc protrusion at L5-S1 with resultant mild to moderate right greater than left lateral recess stenosis, potentially affecting either of the descending S1 nerve roots, overall appearance is relatively stable as compared to 2015, no other new or progressive symptoms, this MRI was compared to prior radiographs from December 2019  and MRI from June 2015, this MRI was completed March 2020.  Review of Systems: Patient complains of symptoms per HPI as well as the following symptoms: headache.  Pertinent negatives and positives per HPI. All others negative.   Social History   Socioeconomic History  . Marital status: Single    Spouse name: Not on file  . Number of children: 2   . Years of education: Not on file  . Highest education level: Not on file  Occupational History  . Occupation: medical asst  Tobacco Use  . Smoking status: Never Smoker  . Smokeless tobacco: Never Used  Vaping Use  . Vaping Use: Never used  Substance and Sexual Activity  . Alcohol use: No    Alcohol/week: 0.0 standard drinks  . Drug use: No  . Sexual activity: Yes    Birth control/protection: Pill  Other Topics Concern  . Not on file  Social History Narrative   No regular exercise.     Lives at home with kids    Caffeine: daily, 12 oz soda every morning   Right handed   Social Determinants of Health   Financial Resource Strain:   . Difficulty of Paying Living Expenses: Not on file  Food Insecurity:   . Worried About Programme researcher, broadcasting/film/video in the Last Year: Not on file  . Ran Out of Food in the Last Year: Not on file  Transportation Needs:   . Lack of Transportation (Medical): Not on file  . Lack of Transportation (Non-Medical): Not on file  Physical Activity:   . Days of Exercise per Week: Not on file  . Minutes of Exercise per Session: Not on file  Stress:   . Feeling of Stress : Not on file  Social Connections:   . Frequency of Communication with Friends and Family: Not on file  . Frequency of Social Gatherings with Friends and Family: Not on file  . Attends Religious Services: Not on file  . Active Member of Clubs or Organizations: Not on file  . Attends Banker Meetings: Not on file  . Marital Status: Not on file  Intimate Partner Violence:   . Fear of Current or Ex-Partner: Not on file  . Emotionally Abused: Not on file  . Physically Abused: Not on file  . Sexually Abused: Not on file    Family History  Problem Relation Age of Onset  . Hypertension Mother   . Diabetes Mother   . Hypertension Maternal Grandmother   . Diabetes Maternal Grandmother   . Kidney disease Maternal Grandmother   . Rheum arthritis Paternal Grandmother   .  Hypertension Paternal Grandmother   . Breast cancer Paternal Aunt   . Stroke Father        after surgery SDH  . Other Father        SDH- spontaneous 2020, had burr holes, recurrent SDH craniotomy  . Migraines Neg Hx     Past Medical History:  Diagnosis Date  . Abnormal Pap smear of cervix    ASCUS + HPV and neg Colpo  . Adjustment disorder with mixed anxiety and depressed mood   . Attention deficit disorder predominant inattentive type   . Depression    effexor  . GAD (generalized anxiety disorder)   . Migraines   . Obesity   . Osteoarthritis   . Sleep apnea    obstructive, controlled on CPAP  . Spinal stenosis   . Vitamin D deficiency     Patient Active  Problem List   Diagnosis Date Noted  . Chronic migraine without aura without status migrainosus, not intractable 03/25/2019  . Left ankle injury 03/01/2015  . Migraine 10/20/2010  . OBESITY 06/03/2010  . Anxiety state, unspecified 06/03/2010  . SLEEP APNEA 06/03/2010  . UNSPECIFIED VITAMIN D DEFICIENCY 05/10/2010    Past Surgical History:  Procedure Laterality Date  . BREAST SURGERY  2011   Breast reduction  . CESAREAN SECTION     x2  . COLPOSCOPY    . LTCS  2005   pt does not recall this     Current Outpatient Medications  Medication Sig Dispense Refill  . amphetamine-dextroamphetamine (ADDERALL) 20 MG tablet Take 1 tablet by mouth daily. Takes 10mg     . citalopram (CELEXA) 40 MG tablet TAKE 1/2 TABLET BY MOUTH DAILY FOR 1 WEEK, THEN 1 TABLET DAILY 30 tablet 1  . Cyanocobalamin (VITAMIN B-12 IJ) Inject 1,000 mcg as directed every 30 (thirty) days.    . fexofenadine (ALLEGRA) 180 MG tablet Take 180 mg by mouth daily.    . Fremanezumab-vfrm (AJOVY) 225 MG/1.5ML SOAJ Inject 225 mg into the skin every 30 (thirty) days. 1.5 mL 5  . gabapentin (NEURONTIN) 100 MG capsule Takes 1 -300 at bedtime    . Goodman-Lysine 500 MG TABS Take 500 mg by mouth daily.    . norethindrone-ethinyl estradiol (MICROGESTIN,JUNEL,LOESTRIN)  1-20 MG-MCG tablet Take 1 tablet by mouth daily.    OVER THE COUNTER MEDICATION daily. Super C (Vitamin C, D, & Zinc)    . methylPREDNISolone (MEDROL DOSEPAK) 4 MG TBPK tablet Taper pack as directed 1 each 0  . Ubrogepant (UBRELVY) 100 MG TABS Take 100 mg by mouth as needed (as needed for acute migraine onset. may repeat x1 after 2 hrs. max dose 200mg /24hrs). 10 tablet 3   No current facility-administered medications for this visit.    Allergies as of 02/02/2020 - Review Complete 05/05/2019  Allergen Reaction Noted  . Oxycodone-acetaminophen Hives and Itching 03/01/2015  . Penicillins      Vitals: BP (!) 143/93 Comment: pt sayd she checked her bp at home  Pulse (!) 104   Ht 5\' 5"  (1.651 m)   Wt 288 lb (130.6 kg)   BMI 47.93 kg/m   Last Weight:  Wt Readings from Last 1 Encounters:  02/02/20 288 lb (130.6 kg)   Last Height:   Ht Readings from Last 1 Encounters:  02/02/20 5\' 5"  (1.651 m)     Physical exam: Exam: Gen: NAD, conversant, well nourised, morbidly obese, well groomed                     CV: RRR, no MRG. No Carotid Bruits. No peripheral edema, warm, nontender Eyes: Conjunctivae clear without exudates or hemorrhage  Neuro: Detailed Neurologic Exam  Speech:    Speech is normal; fluent and spontaneous with normal comprehension.  Cognition:    The patient is oriented to person, place, and time;     recent and remote memory intact;     language fluent;     normal attention, concentration,     fund of knowledge Cranial Nerves:    The pupils are equal, round, and reactive to light. The fundi are normal and spontaneous venous pulsations are present. Visual fields are full to finger confrontation. Extraocular movements are intact. Trigeminal sensation is intact and the muscles of mastication are normal. The face is symmetric. The palate elevates in the midline. Hearing intact. Voice is normal. Shoulder shrug  is normal. The tongue has normal motion without  fasciculations.   Coordination:    Normal finger to nose and heel to shin. Normal rapid alternating movements.   Gait:    Heel-toe and tandem gait are normal.   Motor Observation:    No asymmetry, no atrophy, and no involuntary movements noted. Tone:    Normal muscle tone.    Posture:    Posture is normal. normal erect    Strength:    Strength is V/V in the upper and lower limbs.      Sensation: intact to LT     Reflex Exam:  DTR's:    Hypo right Aj as compared to the lest. Deep tendon reflexes in the upper and lower extremities are normal bilaterally.   Toes:    The toes are downgoing bilaterally.   Clonus:    Clonus is absent.     Assessment/Plan:    Pleasant 39 year old female with chronic migraines and chronic tension headaches   Migraines: -Greatly improved after initiation of Ajovy recently experiencing typical migraine headache with Covid infection last month but prior to that, has not experienced a migraine "in months" -Use of Ubrelvy sample pac with resolution of migraine  -Intolerant to Imitrex and Botox injections -Continue Ajovy monthly injections for migraine prophylaxis -Ubrelvy  as needed for acute migraine relief.  May repeat x1 after 2 hours with max dose 200 mg/24 hrs -Provided co-pay cards for both Ajovy and Ubrelvy  Tension headaches: -Recent worsening likely due to COVID infection (dx in September) -Provided steroid taper pack for possible benefit but did discuss post COVID headache can take anywhere from a few weeks to a few months to resolve.  She was advised to limit use of OTC medications due to possible rebound headache.  Advised to call if headache does not improve  OSA -Recently noncompliant with CPAP due to continued excessive daytime fatigue -prior sleep study 06/2016 showing mild sleep apnea at AHI of 9.8 completed at [facility] -Referral placed to [facility] sleep clinic for reevaluation and potentially narcolepsy evaluation if  indicated -Discussed importance of sleep apnea management to decrease increased risk of stroke and cardiovascular disease as well as migraine and tension headache benefit   Follow-up in 6 months or call earlier if needed  Orders Placed This Encounter  Procedures  . Ambulatory referral to Sleep Studies    Referral Priority:   Routine    Referral Type:   Consultation    Referral Reason:   Specialty Services Required    Number of Visits Requested:   1    Meds ordered this encounter  Medications  . Ubrogepant (UBRELVY) 100 MG TABS    Sig: Take 100 mg by mouth as needed (as needed for acute migraine onset. may repeat x1 after 2 hrs. max dose /24hrs).    Dispense:  10 tablet    Refill:  3  . Fremanezumab-vfrm (AJOVY) 225 MG/1.5ML SOAJ    Sig: Inject 225 mg into the skin every 30 (thirty) days.    Dispense:  1.5 mL    Refill:  5    Order Specific Question:   Lot Number?    Answer:   [id number]    Order Specific Question:   NDC    Answer:   54098-119-14 [[id number]]    Order Specific Question:   Quantity    Answer:   2    Comments:   pens  . methylPREDNISolone (MEDROL DOSEPAK) 4 MG TBPK tablet    Sig: Taper  pack as directed    Dispense:  1 each    Refill:  0    Cc: PCP: Gabrielle Hansen, NP [facility] provider: Dr. Lucia Gaskins   I spent 30 minutes of face-to-face and non-face-to-face time with patient.  This included previsit chart review, lab review, study review, order entry, electronic health record documentation, patient education and discussion regarding chronic migraine headaches, chronic tension headaches with recent worsening likely in setting of recent COVID-19 infection, history of sleep apnea and importance of CPAP management, and answered all other questions to patient satisfaction  Gabrielle Goodman, Gabrielle Goodman  [facility] [address] [place], [place] [address]  Phone [phone] Fax [phone] Note: This document was prepared with digital  dictation and possible smart phrase technology. Any transcriptional errors that result from this process are unintentional.  Made any corrections needed, and agree with history, physical, neuro exam,assessment and plan as stated.     Gabrielle Dean, MD [facility]

## 2020-02-02 NOTE — Patient Instructions (Addendum)
Your Plan:  Continue Ajovy and restart Ubrevly   Do steroid taper pac which can sometimes help with tension headaches after covid - your headache should continue to get better as time goes on  Referral placed to [facility] sleep clinic to further evaluate your excessive day time sleepiness   Follow up in 6 months or call earlier if needed     Thank you for coming to see [place] at [facility]. I hope we have been able to provide you high quality care today.  You may receive a patient satisfaction survey over the next few weeks. We would appreciate your feedback and comments so that we may continue to improve ourselves and the health of our patients.

## 2020-02-03 ENCOUNTER — Encounter: Payer: Self-pay | Admitting: Adult Health

## 2020-02-03 MED ORDER — ONDANSETRON 4 MG PO TBDP: 4.0000 mg | ORAL_TABLET | Freq: Three times a day (TID) | ORAL | 5 refills | Status: AC | PRN

## 2020-02-12 NOTE — Telephone Encounter (Signed)
Completed Ajovy renewal PA on Cover My Meds. Key: [id number]. Awaiting determination from [place].

## 2020-02-12 NOTE — Telephone Encounter (Signed)
PA Case: [id number], Status: Approved, Coverage Starts on: 02/18/2020 12:00:00 AM, Coverage Ends on: 02/06/2022 12:00:00 AM.

## 2020-02-23 ENCOUNTER — Telehealth: Payer: Self-pay

## 2020-02-23 NOTE — Telephone Encounter (Signed)
A PA sent in via [facility] for Gabrielle Goodman  [facility] Rx #: [id number] Gabrielle Goodman 100MG  tablets Please wait for [facility] to return a determination.

## 2020-02-24 NOTE — Telephone Encounter (Signed)
PA Case: [id number], Status: Approved, Coverage Starts on: 02/23/2020 12:00:00 AM, Coverage Ends on: 08/21/2020 12:00:00 AM.

## 2020-03-25 ENCOUNTER — Encounter: Payer: Self-pay | Admitting: Adult Health

## 2020-05-18 ENCOUNTER — Encounter: Payer: Self-pay | Admitting: Adult Health

## 2020-05-19 MED ORDER — AJOVY 225 MG/1.5ML ~~LOC~~ SOAJ: SUBCUTANEOUS | 0 refills | Status: DC

## 2020-05-19 NOTE — Telephone Encounter (Signed)
Pt picked up sample of ajovy.

## 2020-05-27 ENCOUNTER — Telehealth: Payer: Self-pay | Admitting: *Deleted

## 2020-05-27 DIAGNOSIS — G43709 Chronic migraine without aura, not intractable, without status migrainosus: Secondary | ICD-10-CM

## 2020-05-27 MED ORDER — AJOVY 225 MG/1.5ML ~~LOC~~ SOAJ: 225.0000 mg | SUBCUTANEOUS | 5 refills | Status: AC

## 2020-05-27 NOTE — Telephone Encounter (Signed)
[place] @ [facility] called to inform the Gabrielle Goodman has been approved from 05-27-20 to 05-27-21 if there are questions they can be called back at [phone] This is FYI for [facility]

## 2020-05-27 NOTE — Telephone Encounter (Signed)
Initiated CMM Key: [id number] - PA Case ID: [id number]  AJOVY. [employer] ID [id number], Diag [id number], tried amitriptyline, gabapentin, motrin, reglan, sumatriptan, ubrelvy, botox, naproxen propranolol, topamax celexa, effexor.

## 2020-05-27 NOTE — Telephone Encounter (Signed)
Spoke to pt and relayed that her  ajovy approved and new prescription went to [facility].

## 2020-06-17 ENCOUNTER — Telehealth: Payer: Self-pay | Admitting: *Deleted

## 2020-06-17 NOTE — Telephone Encounter (Signed)
Completed Bernita Raisin PA on Cover My Meds. KEY: Key: [id number].   JGOTLX:[id number];HRCBUL:[id number];Review Type:Prior Auth;Coverage Start Date:06/17/2020;Coverage End Date:06/17/2021;

## 2020-07-06 ENCOUNTER — Encounter: Payer: Self-pay | Admitting: Adult Health

## 2020-08-02 ENCOUNTER — Ambulatory Visit: Payer: Managed Care, Other (non HMO) | Admitting: Adult Health

## 2020-08-02 NOTE — Progress Notes (Deleted)
[id number]    Provider:  Dr Lucia Gaskins Requesting Provider: Iona Hansen, NP Primary Care Provider:  Iona Hansen, NP  CC:  Migraine  HPI:   Today, 02/02/2020, Ms. Baade returns for migraine follow-up.  Initial consult visit with Dr. Lucia Gaskins in 03/2019 with 12 migraine days a month and daily headaches.  She received Botox injections on 05/05/2019 but reports having intermittent left facial "nerve pain" post injections for approx 3 months. She has not experienced these in the past couple of months.  She remains on monthly Ajovy injections. Ajovy has greatly decreased migraine frequency. She did have a migraine with recent covid infection but had not experienced a migraine for months prior to COVID infection.  She did use ubrevely samples with benefit. She has not used imitrex as she reports making headaches worse and difficulty tolerating.  Since her recent Covid infection approximately 1 month ago, she has been experiencing a daily tension type headache which can fluctuate with severity and will use Excedrin when headaches start interfering with daily activity. She does admit to not using CPAP over the past couple of months as "it was not helping" in regards to her daily fatigue.  She was compliant with CPAP for over 1 year.  No further concerns at this time.    History provided for reference purposes only Initial visit 03/25/2019 Dr. Lucia Gaskins:  Tyronza Happe is a 40 y.o. female here as requested by Iona Hansen, NP for migraines. She was also referred for sleep apnea and is in the process of getting that scheduled. PMHX severe obesity, ADHD, adjustment disorder with mixed anxiety, migraine was well controlled on propranolol but too sedating, sleep apnea controlled on CPAP, migraines, lumbar spinal stenoses and radiculopathy, OSA, neuropathy right leg.  I reviewed Boyd Kerbs Jones's note.  Patient's been trying to lose weight.  She is trying to walk.  Patient has a history of migraines  and has actually been to the emergency room, she was started on steroids and Benadryl, no headaches since then, she takes Adderall daily for work, saw neuro and has a CPAP now for obstructive sleep apnea, she is tried amitriptyline, gabapentin and increasing this for problems with her low back.  She has had migraines for most of her life, started as a teenager, she tried many things throughout the years, she tried Topamax and she could not function on it. They have worsened. She has 12 migraine days a month. She has daily headaches. Mostly on the left, behind the eye and radiates down the back and to the neck. Imitrex makes her tired. She is on gabapentin, amitriptyline. Also celexa. Pulsating, pounding, throbbing, movement makes it worse, light.sound, nausea and vomiting, can last 24-72 on average and one lasted all week and went to the ED. Also tried propranolol. She uses her cpap regularly, auto pap. She also has sciatic pain, she has had ESIs which helped but the pain came back. One ESI helps for a year. Pain radiates and burnins into the lower spn, stays around the lower back and can radiate down the back.   Reviewed notes, labs and imaging from outside physicians, which showed:  MRI of the lumbar spine, shallow right subarticular disc protrusion at L5-S1 with resultant mild to moderate right greater than left lateral recess stenosis, potentially affecting either of the descending S1 nerve roots, overall appearance is relatively stable as compared to 2015, no other new or progressive symptoms, this MRI was compared to prior radiographs from December 2019  and MRI from June 2015, this MRI was completed March 2020.  Review of Systems: Patient complains of symptoms per HPI as well as the following symptoms: headache.  Pertinent negatives and positives per HPI. All others negative.   Social History   Socioeconomic History  . Marital status: Single    Spouse name: Not on file  . Number of children: 2   . Years of education: Not on file  . Highest education level: Not on file  Occupational History  . Occupation: medical asst  Tobacco Use  . Smoking status: Never Smoker  . Smokeless tobacco: Never Used  Vaping Use  . Vaping Use: Never used  Substance and Sexual Activity  . Alcohol use: No    Alcohol/week: 0.0 standard drinks  . Drug use: No  . Sexual activity: Yes    Birth control/protection: Pill  Other Topics Concern  . Not on file  Social History Narrative   No regular exercise.     Lives at home with kids    Caffeine: daily, 12 oz soda every morning   Right handed   Social Determinants of Health   Financial Resource Strain: Not on file  Food Insecurity: Not on file  Transportation Needs: Not on file  Physical Activity: Not on file  Stress: Not on file  Social Connections: Not on file  Intimate Partner Violence: Not on file    Family History  Problem Relation Age of Onset  . Hypertension Mother   . Diabetes Mother   . Hypertension Maternal Grandmother   . Diabetes Maternal Grandmother   . Kidney disease Maternal Grandmother   . Rheum arthritis Paternal Grandmother   . Hypertension Paternal Grandmother   . Breast cancer Paternal Aunt   . Stroke Father        after surgery SDH  . Other Father        SDH- spontaneous 2020, had burr holes, recurrent SDH craniotomy  . Migraines Neg Hx     Past Medical History:  Diagnosis Date  . Abnormal Pap smear of cervix    ASCUS + HPV and neg Colpo  . Adjustment disorder with mixed anxiety and depressed mood   . Attention deficit disorder predominant inattentive type   . Depression    effexor  . GAD (generalized anxiety disorder)   . Migraines   . Obesity   . Osteoarthritis   . Sleep apnea    obstructive, controlled on CPAP  . Spinal stenosis   . Vitamin D deficiency     Patient Active Problem List   Diagnosis Date Noted  . Chronic migraine without aura without status migrainosus, not intractable 03/25/2019   . Left ankle injury 03/01/2015  . Migraine 10/20/2010  . OBESITY 06/03/2010  . Anxiety state, unspecified 06/03/2010  . SLEEP APNEA 06/03/2010  . UNSPECIFIED VITAMIN D DEFICIENCY 05/10/2010    Past Surgical History:  Procedure Laterality Date  . BREAST SURGERY  2011   Breast reduction  . CESAREAN SECTION     x2  . COLPOSCOPY    . LTCS  2005   pt does not recall this     Current Outpatient Medications  Medication Sig Dispense Refill  . amphetamine-dextroamphetamine (ADDERALL) 20 MG tablet Take 1 tablet by mouth daily. Takes 10mg     . citalopram (CELEXA) 40 MG tablet TAKE 1/2 TABLET BY MOUTH DAILY FOR 1 WEEK, THEN 1 TABLET DAILY 30 tablet 1  . Cyanocobalamin (VITAMIN B-12 IJ) Inject 1,000 mcg as directed every 30 (thirty)  days.    . fexofenadine (ALLEGRA) 180 MG tablet Take 180 mg by mouth daily.    . Fremanezumab-vfrm (AJOVY) 225 MG/1.5ML SOAJ Inject 225 mg into the skin every 30 (thirty) days. 1.5 mL 5  . gabapentin (NEURONTIN) 100 MG capsule Takes 1 -300 at bedtime    . L-Lysine 500 MG TABS Take 500 mg by mouth daily.    . methylPREDNISolone (MEDROL DOSEPAK) 4 MG TBPK tablet Taper pack as directed 1 each 0  . norethindrone-ethinyl estradiol (MICROGESTIN,JUNEL,LOESTRIN) 1-20 MG-MCG tablet Take 1 tablet by mouth daily.    . ondansetron (ZOFRAN ODT) 4 MG disintegrating tablet Take 1 tablet (4 mg total) by mouth every 8 (eight) hours as needed for nausea or vomiting (migraine). 20 tablet 5  . OVER THE COUNTER MEDICATION daily. Super C (Vitamin C, D, & Zinc)    . Ubrogepant (UBRELVY) 100 MG TABS Take 100 mg by mouth as needed (as needed for acute migraine onset. may repeat x1 after 2 hrs. max dose 200mg /24hrs). 10 tablet 3   No current facility-administered medications for this visit.    Allergies as of 08/02/2020 - Review Complete 02/02/2020  Allergen Reaction Noted  . Oxycodone-acetaminophen Hives and Itching 03/01/2015  . Penicillins      Vitals: There were no vitals  taken for this visit.  Last Weight:  Wt Readings from Last 1 Encounters:  02/02/20 288 lb (130.6 kg)   Last Height:   Ht Readings from Last 1 Encounters:  02/02/20 5\' 5"  (1.651 m)     Physical exam: Exam: Gen: NAD, conversant, well nourised, morbidly obese, well groomed                     CV: RRR, no MRG. No Carotid Bruits. No peripheral edema, warm, nontender Eyes: Conjunctivae clear without exudates or hemorrhage  Neuro: Detailed Neurologic Exam  Speech:    Speech is normal; fluent and spontaneous with normal comprehension.  Cognition:    The patient is oriented to person, place, and time;     recent and remote memory intact;     language fluent;     normal attention, concentration,     fund of knowledge Cranial Nerves:    The pupils are equal, round, and reactive to light. The fundi are normal and spontaneous venous pulsations are present. Visual fields are full to finger confrontation. Extraocular movements are intact. Trigeminal sensation is intact and the muscles of mastication are normal. The face is symmetric. The palate elevates in the midline. Hearing intact. Voice is normal. Shoulder shrug is normal. The tongue has normal motion without fasciculations.   Coordination:    Normal finger to nose and heel to shin. Normal rapid alternating movements.   Gait:    Heel-toe and tandem gait are normal.   Motor Observation:    No asymmetry, no atrophy, and no involuntary movements noted. Tone:    Normal muscle tone.    Posture:    Posture is normal. normal erect    Strength:    Strength is V/V in the upper and lower limbs.      Sensation: intact to LT     Reflex Exam:  DTR's:    Hypo right Aj as compared to the lest. Deep tendon reflexes in the upper and lower extremities are normal bilaterally.   Toes:    The toes are downgoing bilaterally.   Clonus:    Clonus is absent.     Assessment/Plan:    Pleasant 40 year old  female with chronic migraines and  chronic tension headaches   Migraines: -Greatly improved after initiation of Ajovy recently experiencing typical migraine headache with Covid infection last month but prior to that, has not experienced a migraine "in months" -Use of Ubrelvy sample pac with resolution of migraine  -Intolerant to Imitrex and Botox injections -Continue Ajovy monthly injections for migraine prophylaxis -Ubrelvy 100mg  as needed for acute migraine relief.  May repeat x1 after 2 hours with max dose 200 mg/24 hrs -Provided co-pay cards for both Ajovy and Ubrelvy  Tension headaches: -Recent worsening likely due to COVID infection (dx in September) -Provided steroid taper pack for possible benefit but did discuss post COVID headache can take anywhere from a few weeks to a few months to resolve.  She was advised to limit use of OTC medications due to possible rebound headache.  Advised to call if headache does not improve  OSA -Recently noncompliant with CPAP due to continued excessive daytime fatigue -prior sleep study 06/2016 showing mild sleep apnea at [facility]HI [facility] 9.8 [facility] -Referral placed to [facility] sleep clinic for reevaluation and potentially narcolepsy evaluation if indicated -Discussed importance of sleep apnea management to decrease increased risk of stroke and cardiovascular disease as well as migraine and tension headache benefit   Follow-up in 6 months or call earlier if needed  No orders of the defined types were placed in this encounter.   No orders of the defined types were placed in this encounter.   Cc: PCP: Iona HansenJones, Penny L, NP [facility] provider: Dr. Lucia GaskinsAhern   I spent 30 minutes of face-to-face and non-face-to-face time with patient.  This included previsit chart review, lab review, study review, order entry, electronic health record documentation, patient education and discussion regarding chronic migraine headaches, chronic tension headaches with recent worsening likely in setting of recent  COVID-19 infection, history of sleep apnea and importance of CPAP management, and answered all other questions to patient satisfaction   Ihor AustinJessica McCue, AGNP-BC  [facility] [address] [place], [place] [address]  Phone [phone] Fax [phone] Note: This document was prepared with digital dictation and possible smart phrase technology. Any transcriptional errors that result from this process are unintentional.

## 2021-12-09 NOTE — Progress Notes (Deleted)
Office Visit Note  Patient: Gabrielle Goodman             Date of Birth: 10-22-1980           MRN: [id number]             PCP: Berkley Harvey, NP Referring: Berkley Harvey, NP Visit Date: 12/20/2021 Occupation: _0 @  Subjective:  No chief complaint on file.   History of Present Illness: Gabrielle Goodman is a 41 y.o. female ***   Activities of Daily Living:  Patient reports morning stiffness for *** {minute/hour:19697}.   Patient {ACTIONS;DENIES/REPORTS:21021675::"Denies"} nocturnal pain.  Difficulty dressing/grooming: {ACTIONS;DENIES/REPORTS:21021675::"Denies"} Difficulty climbing stairs: {ACTIONS;DENIES/REPORTS:21021675::"Denies"} Difficulty getting out of chair: {ACTIONS;DENIES/REPORTS:21021675::"Denies"} Difficulty using hands for taps, buttons, cutlery, and/or writing: {ACTIONS;DENIES/REPORTS:21021675::"Denies"}  No Rheumatology ROS completed.   PMFS History:  Patient Active Problem List   Diagnosis Date Noted   Chronic migraine without aura without status migrainosus, not intractable 03/25/2019   Left ankle injury 03/01/2015   Migraine 10/20/2010   OBESITY 06/03/2010   Anxiety state, unspecified 06/03/2010   SLEEP APNEA 06/03/2010   UNSPECIFIED VITAMIN D DEFICIENCY 05/10/2010    Past Medical History:  Diagnosis Date   Abnormal Pap smear of cervix    ASCUS + HPV and neg Colpo   Adjustment disorder with mixed anxiety and depressed mood    Attention deficit disorder predominant inattentive type    Depression    effexor   GAD (generalized anxiety disorder)    Migraines    Obesity    Osteoarthritis    Sleep apnea    obstructive, controlled on CPAP   Spinal stenosis    Vitamin D deficiency     Family History  Problem Relation Age of Onset   Hypertension Mother    Diabetes Mother    Hypertension Maternal Grandmother    Diabetes Maternal Grandmother    Kidney disease Maternal Grandmother    Rheum arthritis Paternal Grandmother    Hypertension Paternal  Grandmother    Breast cancer Paternal Aunt    Stroke Father        after surgery SDH   Other Father        SDH- spontaneous 2020, had burr holes, recurrent SDH craniotomy   Migraines Neg Hx    Past Surgical History:  Procedure Laterality Date   BREAST SURGERY  2011   Breast reduction   CESAREAN SECTION     x2   COLPOSCOPY     LTCS  2005   pt does not recall this    Social History   Social History Narrative   No regular exercise.     Lives at home with kids    Caffeine: daily, 12 oz soda every morning   Right handed   Immunization History  Administered Date(s) Administered   Influenza Whole 04/26/2010   PFIZER(Purple Top)SARS-COV-2 Vaccination 03/30/2020   Td 07/23/2001   Unspecified SARS-COV-2 Vaccination 03/30/2020     Objective: Vital Signs: There were no vitals taken for this visit.   Physical Exam   Musculoskeletal Exam: ***  CDAI Exam: CDAI Score: -- Patient Global: --; Provider Global: -- Swollen: --; Tender: -- Joint Exam 12/20/2021   No joint exam has been documented for this visit   There is currently no information documented on the homunculus. Go to the Rheumatology activity and complete the homunculus joint exam.  Investigation: No additional findings.  Imaging: No results found.  Recent Labs: Lab Results  Component Value Date   WBC 6.2 02/07/2012  HGB 10.9 (L) 02/07/2012   PLT 354 02/07/2012   NA 139 04/20/2011   K 4.5 04/20/2011   CL 103 04/20/2011   CO2 28 04/20/2011   GLUCOSE 74 04/20/2011   BUN 8 04/20/2011   CREATININE 0.66 04/20/2011   BILITOT 0.3 04/20/2011   ALKPHOS 69 04/20/2011   AST 13 04/20/2011   ALT 9 04/20/2011   PROT 6.9 04/20/2011   ALBUMIN 4.0 04/20/2011   CALCIUM 9.3 04/20/2011   GFRAA >89 04/20/2011    Speciality Comments: No specialty comments available.  Procedures:  No procedures performed Allergies: Oxycodone-acetaminophen and Penicillins   Assessment / Plan:     Visit Diagnoses: Positive ANA  (antinuclear antibody) - 03/08/18: ANA 1:160speckled, ESR 45, 02/22/21: CRP 26.2, thyroglobulin ab negative  Other fatigue  Rash  Hair loss  Vitamin D deficiency  Hx of migraines  History of sleep apnea  History of adjustment disorder  Orders: No orders of the defined types were placed in this encounter.  No orders of the defined types were placed in this encounter.   Face-to-face time spent with patient was *** minutes. Greater than 50% of time was spent in counseling and coordination of care.  Follow-Up Instructions: No follow-ups on file.   Ofilia Neas, PA-C  Note - This record has been created using Dragon software.  Chart creation errors have been sought, but may not always  have been located. Such creation errors do not reflect on  the standard of medical care.

## 2021-12-20 ENCOUNTER — Ambulatory Visit: Payer: 59 | Admitting: Rheumatology

## 2021-12-20 DIAGNOSIS — Z8659 Personal history of other mental and behavioral disorders: Secondary | ICD-10-CM

## 2021-12-20 DIAGNOSIS — Z8669 Personal history of other diseases of the nervous system and sense organs: Secondary | ICD-10-CM

## 2021-12-20 DIAGNOSIS — E559 Vitamin D deficiency, unspecified: Secondary | ICD-10-CM

## 2021-12-20 DIAGNOSIS — R768 Other specified abnormal immunological findings in serum: Secondary | ICD-10-CM

## 2021-12-20 DIAGNOSIS — R5383 Other fatigue: Secondary | ICD-10-CM

## 2021-12-20 DIAGNOSIS — L659 Nonscarring hair loss, unspecified: Secondary | ICD-10-CM

## 2021-12-20 DIAGNOSIS — R21 Rash and other nonspecific skin eruption: Secondary | ICD-10-CM

## 2022-01-03 ENCOUNTER — Ambulatory Visit: Payer: 59 | Admitting: Rheumatology
